# Patient Record
Sex: Female | Born: 1955
Health system: Southern US, Community
[De-identification: ages and names within clinical notes are randomized; demographics above are authoritative.]

## PROBLEM LIST (undated history)

## (undated) DIAGNOSIS — T7840XA Allergy, unspecified, initial encounter: Secondary | ICD-10-CM

## (undated) DIAGNOSIS — J45909 Unspecified asthma, uncomplicated: Secondary | ICD-10-CM

## (undated) DIAGNOSIS — M3500 Sicca syndrome, unspecified: Secondary | ICD-10-CM

## (undated) DIAGNOSIS — H269 Unspecified cataract: Secondary | ICD-10-CM

## (undated) DIAGNOSIS — K219 Gastro-esophageal reflux disease without esophagitis: Secondary | ICD-10-CM

## (undated) DIAGNOSIS — M810 Age-related osteoporosis without current pathological fracture: Secondary | ICD-10-CM

## (undated) DIAGNOSIS — I7789 Other specified disorders of arteries and arterioles: Secondary | ICD-10-CM

## (undated) HISTORY — DX: Allergy, unspecified, initial encounter: T78.40XA

## (undated) HISTORY — DX: Unspecified cataract: H26.9

## (undated) HISTORY — DX: Other specified disorders of arteries and arterioles: I77.89

## (undated) HISTORY — PX: TONSILLECTOMY: SUR1361

## (undated) HISTORY — DX: Sjogren syndrome, unspecified: M35.00

## (undated) HISTORY — DX: Gastro-esophageal reflux disease without esophagitis: K21.9

## (undated) HISTORY — DX: Age-related osteoporosis without current pathological fracture: M81.0

## (undated) HISTORY — DX: Unspecified asthma, uncomplicated: J45.909

## (undated) HISTORY — PX: EYE SURGERY: SHX253

## (undated) HISTORY — PX: COLONOSCOPY: SHX174

## (undated) HISTORY — PX: KNEE ARTHROSCOPY: SUR90

## (undated) HISTORY — PX: COSMETIC SURGERY: SHX468

---

## 1997-03-12 ENCOUNTER — Ambulatory Visit (HOSPITAL_COMMUNITY): Admission: RE | Admit: 1997-03-12 | Discharge: 1997-03-12 | Payer: Self-pay | Admitting: Family Medicine

## 1997-05-29 ENCOUNTER — Emergency Department (HOSPITAL_COMMUNITY): Admission: EM | Admit: 1997-05-29 | Discharge: 1997-05-29 | Payer: Self-pay | Admitting: Emergency Medicine

## 1997-06-06 ENCOUNTER — Ambulatory Visit (HOSPITAL_COMMUNITY): Admission: RE | Admit: 1997-06-06 | Discharge: 1997-06-06 | Payer: Self-pay | Admitting: Cardiology

## 1997-07-11 ENCOUNTER — Ambulatory Visit (HOSPITAL_COMMUNITY): Admission: RE | Admit: 1997-07-11 | Discharge: 1997-07-11 | Payer: Self-pay | Admitting: Cardiology

## 1997-08-17 ENCOUNTER — Encounter: Admission: RE | Admit: 1997-08-17 | Discharge: 1997-11-15 | Payer: Self-pay | Admitting: Family Medicine

## 1997-08-20 ENCOUNTER — Ambulatory Visit (HOSPITAL_COMMUNITY): Admission: RE | Admit: 1997-08-20 | Discharge: 1997-08-20 | Payer: Self-pay | Admitting: Family Medicine

## 1997-10-15 ENCOUNTER — Encounter: Admission: RE | Admit: 1997-10-15 | Discharge: 1997-10-15 | Payer: Self-pay | Admitting: Sports Medicine

## 1998-04-04 ENCOUNTER — Ambulatory Visit (HOSPITAL_COMMUNITY): Admission: RE | Admit: 1998-04-04 | Discharge: 1998-04-04 | Payer: Self-pay | Admitting: Family Medicine

## 1998-04-04 ENCOUNTER — Encounter: Payer: Self-pay | Admitting: Family Medicine

## 1998-05-07 ENCOUNTER — Other Ambulatory Visit: Admission: RE | Admit: 1998-05-07 | Discharge: 1998-05-07 | Payer: Self-pay | Admitting: Obstetrics and Gynecology

## 1998-12-20 ENCOUNTER — Other Ambulatory Visit: Admission: RE | Admit: 1998-12-20 | Discharge: 1998-12-20 | Payer: Self-pay | Admitting: Obstetrics and Gynecology

## 1999-04-09 ENCOUNTER — Ambulatory Visit (HOSPITAL_COMMUNITY): Admission: RE | Admit: 1999-04-09 | Discharge: 1999-04-09 | Payer: Self-pay | Admitting: Family Medicine

## 1999-04-09 ENCOUNTER — Encounter: Payer: Self-pay | Admitting: Family Medicine

## 1999-04-21 ENCOUNTER — Encounter: Admission: RE | Admit: 1999-04-21 | Discharge: 1999-04-21 | Payer: Self-pay | Admitting: Sports Medicine

## 1999-12-29 ENCOUNTER — Encounter: Admission: RE | Admit: 1999-12-29 | Discharge: 1999-12-29 | Payer: Self-pay | Admitting: Sports Medicine

## 2000-01-06 ENCOUNTER — Other Ambulatory Visit: Admission: RE | Admit: 2000-01-06 | Discharge: 2000-01-06 | Payer: Self-pay | Admitting: Obstetrics and Gynecology

## 2000-01-19 ENCOUNTER — Encounter: Admission: RE | Admit: 2000-01-19 | Discharge: 2000-04-18 | Payer: Self-pay | Admitting: Sports Medicine

## 2000-04-16 ENCOUNTER — Encounter: Admission: RE | Admit: 2000-04-16 | Discharge: 2000-04-16 | Payer: Self-pay | Admitting: Family Medicine

## 2000-04-22 ENCOUNTER — Ambulatory Visit (HOSPITAL_COMMUNITY): Admission: RE | Admit: 2000-04-22 | Discharge: 2000-04-22 | Payer: Self-pay | Admitting: Family Medicine

## 2000-04-22 ENCOUNTER — Encounter: Payer: Self-pay | Admitting: Family Medicine

## 2000-06-03 ENCOUNTER — Other Ambulatory Visit: Admission: RE | Admit: 2000-06-03 | Discharge: 2000-06-03 | Payer: Self-pay | Admitting: Family Medicine

## 2000-08-18 ENCOUNTER — Encounter: Admission: RE | Admit: 2000-08-18 | Discharge: 2000-08-18 | Payer: Self-pay | Admitting: Family Medicine

## 2000-11-30 ENCOUNTER — Encounter: Admission: RE | Admit: 2000-11-30 | Discharge: 2000-11-30 | Payer: Self-pay | Admitting: Sports Medicine

## 2000-11-30 ENCOUNTER — Ambulatory Visit (HOSPITAL_COMMUNITY): Admission: RE | Admit: 2000-11-30 | Discharge: 2000-11-30 | Payer: Self-pay | Admitting: Sports Medicine

## 2001-01-12 ENCOUNTER — Encounter: Admission: RE | Admit: 2001-01-12 | Discharge: 2001-01-12 | Payer: Self-pay | Admitting: Family Medicine

## 2001-02-23 ENCOUNTER — Encounter: Admission: RE | Admit: 2001-02-23 | Discharge: 2001-02-23 | Payer: Self-pay | Admitting: Family Medicine

## 2001-02-23 ENCOUNTER — Encounter: Payer: Self-pay | Admitting: Family Medicine

## 2001-08-11 ENCOUNTER — Encounter: Admission: RE | Admit: 2001-08-11 | Discharge: 2001-08-11 | Payer: Self-pay | Admitting: Sports Medicine

## 2001-10-05 ENCOUNTER — Encounter: Admission: RE | Admit: 2001-10-05 | Discharge: 2001-10-05 | Payer: Self-pay | Admitting: Family Medicine

## 2002-07-31 ENCOUNTER — Encounter: Admission: RE | Admit: 2002-07-31 | Discharge: 2002-07-31 | Payer: Self-pay | Admitting: Family Medicine

## 2002-10-25 ENCOUNTER — Encounter: Admission: RE | Admit: 2002-10-25 | Discharge: 2002-10-25 | Payer: Self-pay | Admitting: Family Medicine

## 2002-11-08 ENCOUNTER — Encounter: Admission: RE | Admit: 2002-11-08 | Discharge: 2002-11-08 | Payer: Self-pay | Admitting: Family Medicine

## 2002-11-10 ENCOUNTER — Encounter: Admission: RE | Admit: 2002-11-10 | Discharge: 2002-11-10 | Payer: Self-pay | Admitting: Family Medicine

## 2003-01-10 ENCOUNTER — Ambulatory Visit (HOSPITAL_COMMUNITY): Admission: RE | Admit: 2003-01-10 | Discharge: 2003-01-10 | Payer: Self-pay | Admitting: Gastroenterology

## 2003-03-01 ENCOUNTER — Emergency Department (HOSPITAL_COMMUNITY): Admission: AD | Admit: 2003-03-01 | Discharge: 2003-03-01 | Payer: Self-pay | Admitting: Family Medicine

## 2003-03-14 ENCOUNTER — Encounter: Admission: RE | Admit: 2003-03-14 | Discharge: 2003-03-14 | Payer: Self-pay | Admitting: Family Medicine

## 2003-03-21 ENCOUNTER — Encounter: Admission: RE | Admit: 2003-03-21 | Discharge: 2003-03-21 | Payer: Self-pay | Admitting: Family Medicine

## 2003-04-24 ENCOUNTER — Encounter: Admission: RE | Admit: 2003-04-24 | Discharge: 2003-04-24 | Payer: Self-pay | Admitting: Family Medicine

## 2003-05-04 ENCOUNTER — Encounter: Admission: RE | Admit: 2003-05-04 | Discharge: 2003-05-04 | Payer: Self-pay | Admitting: Family Medicine

## 2004-04-23 ENCOUNTER — Ambulatory Visit (HOSPITAL_COMMUNITY): Admission: RE | Admit: 2004-04-23 | Discharge: 2004-04-23 | Payer: Self-pay | Admitting: Sports Medicine

## 2004-05-08 ENCOUNTER — Encounter (INDEPENDENT_AMBULATORY_CARE_PROVIDER_SITE_OTHER): Payer: Self-pay | Admitting: *Deleted

## 2004-05-08 LAB — CONVERTED CEMR LAB

## 2004-05-28 ENCOUNTER — Encounter (INDEPENDENT_AMBULATORY_CARE_PROVIDER_SITE_OTHER): Payer: Self-pay | Admitting: *Deleted

## 2004-05-28 ENCOUNTER — Ambulatory Visit: Payer: Self-pay | Admitting: Family Medicine

## 2004-09-10 ENCOUNTER — Ambulatory Visit: Payer: Self-pay | Admitting: Family Medicine

## 2004-11-19 ENCOUNTER — Ambulatory Visit: Payer: Self-pay | Admitting: Family Medicine

## 2004-12-12 ENCOUNTER — Ambulatory Visit: Payer: Self-pay | Admitting: Family Medicine

## 2005-01-02 ENCOUNTER — Ambulatory Visit: Payer: Self-pay | Admitting: Sports Medicine

## 2005-01-05 ENCOUNTER — Encounter: Admission: RE | Admit: 2005-01-05 | Discharge: 2005-01-05 | Payer: Self-pay | Admitting: Sports Medicine

## 2005-01-13 ENCOUNTER — Ambulatory Visit: Payer: Self-pay | Admitting: Sports Medicine

## 2005-02-17 ENCOUNTER — Ambulatory Visit: Payer: Self-pay | Admitting: Sports Medicine

## 2005-05-06 ENCOUNTER — Ambulatory Visit (HOSPITAL_COMMUNITY): Admission: RE | Admit: 2005-05-06 | Discharge: 2005-05-06 | Payer: Self-pay | Admitting: Sports Medicine

## 2005-07-27 ENCOUNTER — Ambulatory Visit: Payer: Self-pay | Admitting: Family Medicine

## 2005-08-19 ENCOUNTER — Ambulatory Visit: Payer: Self-pay | Admitting: Family Medicine

## 2005-08-26 ENCOUNTER — Ambulatory Visit: Payer: Self-pay | Admitting: Family Medicine

## 2005-09-09 ENCOUNTER — Ambulatory Visit: Payer: Self-pay | Admitting: Family Medicine

## 2005-09-20 ENCOUNTER — Emergency Department (HOSPITAL_COMMUNITY): Admission: EM | Admit: 2005-09-20 | Discharge: 2005-09-20 | Payer: Self-pay | Admitting: Emergency Medicine

## 2005-09-23 ENCOUNTER — Encounter (HOSPITAL_COMMUNITY): Admission: RE | Admit: 2005-09-23 | Discharge: 2005-12-22 | Payer: Self-pay | Admitting: Emergency Medicine

## 2005-10-28 ENCOUNTER — Emergency Department (HOSPITAL_COMMUNITY): Admission: EM | Admit: 2005-10-28 | Discharge: 2005-10-28 | Payer: Self-pay | Admitting: Family Medicine

## 2006-04-02 ENCOUNTER — Encounter (INDEPENDENT_AMBULATORY_CARE_PROVIDER_SITE_OTHER): Payer: Self-pay | Admitting: *Deleted

## 2006-05-31 ENCOUNTER — Ambulatory Visit (HOSPITAL_COMMUNITY): Admission: RE | Admit: 2006-05-31 | Discharge: 2006-05-31 | Payer: Self-pay | Admitting: Family Medicine

## 2006-10-08 ENCOUNTER — Ambulatory Visit: Payer: Self-pay | Admitting: Sports Medicine

## 2006-10-08 DIAGNOSIS — M2010 Hallux valgus (acquired), unspecified foot: Secondary | ICD-10-CM | POA: Insufficient documentation

## 2006-10-13 ENCOUNTER — Ambulatory Visit: Payer: Self-pay | Admitting: Sports Medicine

## 2007-03-27 ENCOUNTER — Emergency Department (HOSPITAL_COMMUNITY): Admission: EM | Admit: 2007-03-27 | Discharge: 2007-03-27 | Payer: Self-pay | Admitting: Family Medicine

## 2007-04-15 ENCOUNTER — Ambulatory Visit: Payer: Self-pay | Admitting: Family Medicine

## 2007-07-01 ENCOUNTER — Ambulatory Visit (HOSPITAL_COMMUNITY): Admission: RE | Admit: 2007-07-01 | Discharge: 2007-07-01 | Payer: Self-pay | Admitting: Sports Medicine

## 2007-08-17 ENCOUNTER — Ambulatory Visit: Payer: Self-pay | Admitting: Family Medicine

## 2007-08-17 ENCOUNTER — Encounter: Payer: Self-pay | Admitting: Family Medicine

## 2007-08-17 ENCOUNTER — Telehealth: Payer: Self-pay | Admitting: *Deleted

## 2007-08-17 DIAGNOSIS — J45909 Unspecified asthma, uncomplicated: Secondary | ICD-10-CM | POA: Insufficient documentation

## 2007-08-26 ENCOUNTER — Encounter: Payer: Self-pay | Admitting: Family Medicine

## 2008-02-09 ENCOUNTER — Ambulatory Visit (HOSPITAL_COMMUNITY): Admission: RE | Admit: 2008-02-09 | Discharge: 2008-02-09 | Payer: Self-pay | Admitting: Sports Medicine

## 2008-02-09 ENCOUNTER — Ambulatory Visit: Payer: Self-pay | Admitting: Sports Medicine

## 2008-02-09 DIAGNOSIS — M23359 Other meniscus derangements, posterior horn of lateral meniscus, unspecified knee: Secondary | ICD-10-CM | POA: Insufficient documentation

## 2008-08-07 ENCOUNTER — Ambulatory Visit (HOSPITAL_COMMUNITY): Admission: RE | Admit: 2008-08-07 | Discharge: 2008-08-07 | Payer: Self-pay | Admitting: Family Medicine

## 2008-08-09 ENCOUNTER — Encounter: Payer: Self-pay | Admitting: Family Medicine

## 2008-11-01 ENCOUNTER — Ambulatory Visit (HOSPITAL_COMMUNITY): Admission: RE | Admit: 2008-11-01 | Discharge: 2008-11-01 | Payer: Self-pay | Admitting: Gastroenterology

## 2008-11-02 ENCOUNTER — Emergency Department (HOSPITAL_COMMUNITY): Admission: EM | Admit: 2008-11-02 | Discharge: 2008-11-02 | Payer: Self-pay | Admitting: Emergency Medicine

## 2008-11-04 ENCOUNTER — Encounter: Payer: Self-pay | Admitting: Family Medicine

## 2008-11-09 ENCOUNTER — Ambulatory Visit: Payer: Self-pay | Admitting: Family Medicine

## 2008-11-09 ENCOUNTER — Encounter: Payer: Self-pay | Admitting: Family Medicine

## 2008-11-09 LAB — CONVERTED CEMR LAB
Basophils Absolute: 0 10*3/uL (ref 0.0–0.1)
Basophils Relative: 1 % (ref 0–1)
Eosinophils Absolute: 0.2 10*3/uL (ref 0.0–0.7)
Eosinophils Relative: 3 % (ref 0–5)
Hemoglobin: 14.4 g/dL (ref 12.0–15.0)
Lymphocytes Relative: 31 % (ref 12–46)
Lymphs Abs: 1.9 10*3/uL (ref 0.7–4.0)
Monocytes Absolute: 0.4 10*3/uL (ref 0.1–1.0)
WBC: 5.9 10*3/uL (ref 4.0–10.5)

## 2008-12-05 ENCOUNTER — Ambulatory Visit: Payer: Self-pay | Admitting: Family Medicine

## 2008-12-05 DIAGNOSIS — E538 Deficiency of other specified B group vitamins: Secondary | ICD-10-CM | POA: Insufficient documentation

## 2008-12-05 LAB — CONVERTED CEMR LAB: TSH: 2.395 microintl units/mL (ref 0.350–4.500)

## 2008-12-11 ENCOUNTER — Encounter: Payer: Self-pay | Admitting: Family Medicine

## 2008-12-19 ENCOUNTER — Ambulatory Visit: Payer: Self-pay | Admitting: Family Medicine

## 2008-12-19 ENCOUNTER — Ambulatory Visit (HOSPITAL_COMMUNITY): Admission: RE | Admit: 2008-12-19 | Discharge: 2008-12-19 | Payer: Self-pay | Admitting: Family Medicine

## 2008-12-20 ENCOUNTER — Encounter: Payer: Self-pay | Admitting: Family Medicine

## 2009-02-05 ENCOUNTER — Encounter (INDEPENDENT_AMBULATORY_CARE_PROVIDER_SITE_OTHER): Payer: Self-pay | Admitting: Family Medicine

## 2009-02-13 ENCOUNTER — Encounter: Payer: Self-pay | Admitting: Family Medicine

## 2009-02-14 ENCOUNTER — Encounter: Payer: Self-pay | Admitting: Family Medicine

## 2009-04-26 ENCOUNTER — Encounter: Payer: Self-pay | Admitting: Family Medicine

## 2009-05-20 ENCOUNTER — Encounter: Payer: Self-pay | Admitting: Family Medicine

## 2009-05-20 DIAGNOSIS — M3500 Sicca syndrome, unspecified: Secondary | ICD-10-CM | POA: Insufficient documentation

## 2009-11-11 ENCOUNTER — Encounter: Payer: Self-pay | Admitting: Family Medicine

## 2009-12-12 ENCOUNTER — Ambulatory Visit (HOSPITAL_COMMUNITY): Admission: RE | Admit: 2009-12-12 | Discharge: 2009-12-12 | Payer: Self-pay | Admitting: Family Medicine

## 2010-03-06 NOTE — Miscellaneous (Signed)
  Clinical Lists Changes  Problems: Removed problem of SKIN RASH (ICD-782.1) Removed problem of TOE PAIN (ICD-729.5) Removed problem of HERPANGINA (ICD-074.0) Removed problem of GLOSSITIS (ICD-529.0) Removed problem of KNEE PAIN, LEFT (ICD-719.46) Added new problem of SICCA SYNDROME (ICD-710.2) Medications: Added new medication of PLAQUENIL 200 MG TABS (HYDROXYCHLOROQUINE SULFATE) per rheumatology

## 2010-03-06 NOTE — Consult Note (Signed)
Summary: [facility]   Imported By: Bradly Bienenstock 05/08/2009 10:42:16  _____________________________________________________________________  External Attachment:    Type:   Image     Comment:   External Document

## 2010-03-06 NOTE — Miscellaneous (Signed)
   Clinical Lists Changes  Orders: Added new Referral order of Rheumatology Referral (Rheumatology) - Signed Observations: Added new observation of LLIMPORTMEDS: completed (02/05/2009 8:36)     DEAR Thaily Hackworth TEAM I am sending you a referral Thanks!  Denny Levy MD  February 05, 2009 8:38 AM   Complete Medication List: 1)  Ventolin Hfa 108 (90 Base) Mcg/act Aers (Albuterol sulfate) .Marland Kitchen.. 1-2 puffs as needed disp 1 or 3 months worth of mdi 2)  Singulair 10 Mg Tabs (Montelukast sodium) .Marland Kitchen.. 1 by mouth qd 3)  Advair Diskus 500-50 Mcg/dose Misc (Fluticasone-salmeterol) .Marland Kitchen.. 1 puff two times a day disp 1 or 3  months mdi 4)  Celebrex 200 Mg Caps (Celecoxib) .Marland Kitchen.. 1 by mouth bid 5)  Clobetasol Propionate 0.05 % Crea (Clobetasol propionate) .... Dispense one 30 g tube apply as directed 6)  Premarin 0.625 Mg/gm Crea (Estrogens, conjugated) .Marland Kitchen.. 1 applicator full per vagina at bedtime 3-4 times a week please disp qs    appointment scheduled 02/07/2009 with Dr. Corliss Skains. patient notified and this is not convenient, she will call to reschedule. Theresia Lo RN  February 05, 2009 11:09 AM

## 2010-03-06 NOTE — Miscellaneous (Signed)
  Clinical Lists Changes  Problems: Changed problem from EXTRINSIC ASTHMA, UNSPECIFIED (ICD-493.00) to ASTHMA, INTERMITTENT, MILD (ICD-493.90)

## 2010-03-06 NOTE — Consult Note (Signed)
Summary: [facility]  [facility]   Imported By: Bradly Bienenstock 03/05/2009 16:52:05  _____________________________________________________________________  External Attachment:    Type:   Image     Comment:   External Document

## 2010-05-16 ENCOUNTER — Other Ambulatory Visit: Payer: Self-pay | Admitting: Internal Medicine

## 2010-05-22 ENCOUNTER — Encounter (INDEPENDENT_AMBULATORY_CARE_PROVIDER_SITE_OTHER): Payer: Commercial Managed Care - PPO | Admitting: Internal Medicine

## 2010-05-22 ENCOUNTER — Other Ambulatory Visit (HOSPITAL_COMMUNITY)
Admission: RE | Admit: 2010-05-22 | Discharge: 2010-05-22 | Disposition: A | Discharge status: Home / Self Care | Payer: 59 | Source: Ambulatory Visit | Attending: Internal Medicine | Admitting: Internal Medicine

## 2010-05-22 DIAGNOSIS — Z0000 Encounter for general adult medical examination without abnormal findings: Secondary | ICD-10-CM

## 2010-06-20 NOTE — Op Note (Signed)
NAME:  Jacqueline Henry, Jacqueline Henry                            ACCOUNT NO.:  [id number]   MEDICAL RECORD NO.:  [id number]                   PATIENT TYPE:  AMB   LOCATION:  ENDO                                 FACILITY:  [facility]   PHYSICIAN:  Bernette Redbird, M.D.                DATE OF BIRTH:  Oct 28, 1955   DATE OF PROCEDURE:  01/10/2003  DATE OF DISCHARGE:                                 OPERATIVE REPORT   PROCEDURE:  Colonoscopy.   INDICATIONS:  Family history of colon cancer in her father at the age of 69,  in a 78 year old asymptomatic female.   FINDINGS:  Solitary sigmoid diverticulum, otherwise normal to the terminal  ileum.   DESCRIPTION OF PROCEDURE:  The nature, purpose and risks of the procedure  were familiar to the patient from a prior examination five years ago, when  her examination was negative except for hyperplastic polyp.  She provided  written consent.   The Olympus adjustable tension pediatric video colonoscope was advanced with  slight difficulty to an angulated sigmoid region, turning the patient into  the supine position to facilitate advancement.  We reached the cecum and the  terminal ileum was entered for a short distance; appeared normal.  Pullback  was then performed.  The quality of the prep was excellent and it was felt  that all areas were well seen.   There was a solitary sigmoid diverticulum, but this was otherwise a normal  examination; without evidence of polyps, cancer, colitis, vascular  malformations or extensive diverticulosis.  Retroflexion of the rectum and  on reinspection the rectum was negative.  No biopsies were obtained.  The  patient tolerated the procedure well and there were no apparent  complications.   IMPRESSION:  Essentially normal colonoscopy in a patient with a family  history of colon cancer (V16.0).   PLAN:  Follow with colonoscopy in five years.                                               Bernette Redbird, M.D.    RB/MEDQ  D:   01/10/2003  T:  01/11/2003  Job:  [id number]   cc:   Asencion Partridge, M.D.  [address] [place]  [place] [address]  Fax: [phone]

## 2011-01-02 ENCOUNTER — Other Ambulatory Visit: Payer: Self-pay | Admitting: Internal Medicine

## 2011-01-02 DIAGNOSIS — Z1231 Encounter for screening mammogram for malignant neoplasm of breast: Secondary | ICD-10-CM

## 2011-01-02 DIAGNOSIS — Z780 Asymptomatic menopausal state: Secondary | ICD-10-CM

## 2011-02-04 ENCOUNTER — Telehealth: Payer: Self-pay

## 2011-02-04 DIAGNOSIS — Z780 Asymptomatic menopausal state: Secondary | ICD-10-CM

## 2011-02-04 NOTE — Telephone Encounter (Signed)
Order placed in Epic for bone Density study.

## 2011-02-05 ENCOUNTER — Ambulatory Visit (HOSPITAL_COMMUNITY)
Admission: RE | Admit: 2011-02-05 | Discharge: 2011-02-05 | Disposition: A | Discharge status: Home / Self Care | Payer: 59 | Source: Ambulatory Visit | Attending: Internal Medicine | Admitting: Internal Medicine

## 2011-02-05 ENCOUNTER — Ambulatory Visit (HOSPITAL_COMMUNITY): Payer: Commercial Managed Care - PPO

## 2011-02-05 DIAGNOSIS — Z1231 Encounter for screening mammogram for malignant neoplasm of breast: Secondary | ICD-10-CM | POA: Insufficient documentation

## 2011-02-05 DIAGNOSIS — Z780 Asymptomatic menopausal state: Secondary | ICD-10-CM | POA: Insufficient documentation

## 2011-02-05 DIAGNOSIS — Z13820 Encounter for screening for osteoporosis: Secondary | ICD-10-CM | POA: Insufficient documentation

## 2011-02-09 ENCOUNTER — Other Ambulatory Visit: Payer: Commercial Managed Care - PPO | Admitting: Internal Medicine

## 2011-02-09 DIAGNOSIS — E7800 Pure hypercholesterolemia, unspecified: Secondary | ICD-10-CM

## 2011-02-09 DIAGNOSIS — Z79899 Other long term (current) drug therapy: Secondary | ICD-10-CM

## 2011-02-09 LAB — LIPID PANEL
Cholesterol: 212 mg/dL — ABNORMAL HIGH (ref 0–200)
HDL: 64 mg/dL (ref >39)
LDL Cholesterol: 134 mg/dL — ABNORMAL HIGH (ref 0–99)
Total CHOL/HDL Ratio: 3.3 Ratio
Triglycerides: 71 mg/dL (ref <150)
VLDL: 14 mg/dL (ref 0–40)

## 2011-02-09 LAB — HEPATIC FUNCTION PANEL
Bilirubin, Direct: 0.1 mg/dL (ref 0.0–0.3)
Total Bilirubin: 0.8 mg/dL (ref 0.3–1.2)
Total Protein: 6.9 g/dL (ref 6.0–8.3)

## 2011-02-17 ENCOUNTER — Telehealth: Payer: Self-pay

## 2011-02-17 NOTE — Telephone Encounter (Signed)
Opened in error

## 2012-04-11 ENCOUNTER — Other Ambulatory Visit: Payer: Self-pay | Admitting: Internal Medicine

## 2012-04-11 DIAGNOSIS — Z1231 Encounter for screening mammogram for malignant neoplasm of breast: Secondary | ICD-10-CM

## 2012-04-20 ENCOUNTER — Ambulatory Visit (HOSPITAL_COMMUNITY): Payer: 59

## 2012-04-22 ENCOUNTER — Encounter: Payer: Self-pay | Admitting: Internal Medicine

## 2012-04-22 ENCOUNTER — Ambulatory Visit (INDEPENDENT_AMBULATORY_CARE_PROVIDER_SITE_OTHER): Payer: 59 | Admitting: Internal Medicine

## 2012-04-22 VITALS — BP 106/72 | Temp 98.8°F

## 2012-04-22 DIAGNOSIS — J45909 Unspecified asthma, uncomplicated: Secondary | ICD-10-CM

## 2012-04-22 MED ORDER — ALBUTEROL SULFATE HFA 108 (90 BASE) MCG/ACT IN AERS: 1.0000 | INHALATION_SPRAY | RESPIRATORY_TRACT | Status: DC | PRN

## 2012-04-22 MED ORDER — AMOXICILLIN-POT CLAVULANATE 875-125 MG PO TABS: 1.0000 | ORAL_TABLET | Freq: Two times a day (BID) | ORAL | Status: DC

## 2012-04-22 MED ORDER — FLUTICASONE-SALMETEROL 500-50 MCG/DOSE IN AEPB: 1.0000 | INHALATION_SPRAY | Freq: Two times a day (BID) | RESPIRATORY_TRACT | Status: DC

## 2012-04-22 MED ORDER — PREDNISONE 10 MG PO KIT: PACK | ORAL | Status: DC

## 2012-04-22 MED ORDER — BENZONATATE 100 MG PO CAPS: 100.0000 mg | ORAL_CAPSULE | Freq: Three times a day (TID) | ORAL | Status: DC | PRN

## 2012-04-25 NOTE — Patient Instructions (Addendum)
Take Augmentin 875 mg twice daily for 10 days. Continue using albuterol and Advair inhalers. Decrease amount of running until better.

## 2012-04-25 NOTE — Progress Notes (Signed)
  Subjective:    Patient ID: Jacqueline Henry, female    DOB: 1955/06/20, 57 y.o.   MRN: [id number]  HPI Developed respiratory infection about 3 weeks ago with cough that has persisted. Asthma has flared up. She has been using her inhaler. Cannot run quite as far due to shortness of breath and fatigue. She is an avid runner. No fever or shaking chills. Cough sounds deep and congested. Did have influenza immunization through employment.    Review of Systems     Objective:   Physical Exam HEENT exam: TMs and pharynx are clear. Neck is supple without adenopathy. Chest is clear.dictation without rales or wheezing.        Assessment & Plan:  Asthmatic bronchitis  Plan: Sterapred DS 10 mg 6 day dosepak take as directed. Augmentin 875 mg twice daily with meals for 10 days. Call if symptoms do not resolve. Continue using albuterol inhaler 4 times daily. Cut back on running until better. Continue albuterol and Advair inhalers. May need to restart Singulair. Refill Advair 500/50 one spray Q. 12 hours.

## 2012-04-27 ENCOUNTER — Ambulatory Visit (HOSPITAL_COMMUNITY)
Admission: RE | Admit: 2012-04-27 | Discharge: 2012-04-27 | Disposition: A | Discharge status: Home / Self Care | Payer: 59 | Source: Ambulatory Visit | Attending: Internal Medicine | Admitting: Internal Medicine

## 2012-04-27 DIAGNOSIS — Z1231 Encounter for screening mammogram for malignant neoplasm of breast: Secondary | ICD-10-CM | POA: Insufficient documentation

## 2012-05-04 ENCOUNTER — Ambulatory Visit (HOSPITAL_BASED_OUTPATIENT_CLINIC_OR_DEPARTMENT_OTHER)
Admission: RE | Admit: 2012-05-04 | Discharge: 2012-05-04 | Disposition: A | Discharge status: Home / Self Care | Payer: 59 | Source: Ambulatory Visit | Attending: Family Medicine | Admitting: Family Medicine

## 2012-05-04 ENCOUNTER — Encounter: Payer: Self-pay | Admitting: Family Medicine

## 2012-05-04 ENCOUNTER — Ambulatory Visit (INDEPENDENT_AMBULATORY_CARE_PROVIDER_SITE_OTHER): Payer: 59 | Admitting: Family Medicine

## 2012-05-04 VITALS — BP 122/80 | HR 58 | Ht 65.0 in | Wt 112.0 lb

## 2012-05-04 DIAGNOSIS — M545 Low back pain, unspecified: Secondary | ICD-10-CM | POA: Insufficient documentation

## 2012-05-04 MED ORDER — PREDNISONE (PAK) 10 MG PO TABS: ORAL_TABLET | ORAL | Status: DC

## 2012-05-04 MED ORDER — CELECOXIB 200 MG PO CAPS: 200.0000 mg | ORAL_CAPSULE | Freq: Two times a day (BID) | ORAL | Status: DC

## 2012-05-04 NOTE — Patient Instructions (Addendum)
Get the x-rays of your low back today - we will call you with the results by tomorrow morning (depending on when these come back). I'm concerned about a bulging disc with radiculopathy in your low back (pinched nerve). A prednisone dose pack is the best option for immediate relief and may be prescribed with transition to an anti-inflammatory like celebrex twice a day. Consider Tramadol or vicodin as needed for severe pain (no driving on this medicine). Consider Flexeril as needed for muscle spasms (no driving on this medicine if it makes you sleepy). Stay as active as possible. Physical therapy has been shown to be helpful as well - continue your home exercises. Strengthening of low back muscles, abdominal musculature are key for long term pain relief. If not improving, will consider further imaging (MRI). Follow up with me in 1 week if not improving.

## 2012-05-05 ENCOUNTER — Encounter: Payer: Self-pay | Admitting: Family Medicine

## 2012-05-05 DIAGNOSIS — M545 Low back pain, unspecified: Secondary | ICD-10-CM | POA: Insufficient documentation

## 2012-05-05 NOTE — Assessment & Plan Note (Signed)
Radiographs negative for compression fracture.  Consistent with lumbar radiculopathy though level would typically be higher than her prior radiculopathic episode.  Start with prednisone dose pack then transition to celebrex.  Continue home exercises.  F/u in 1 week if not improving - would consider repeating her lumbar spine MRI at that time.

## 2012-05-05 NOTE — Progress Notes (Signed)
Subjective:    Patient ID: Jacqueline Henry, female    DOB: Jun 27, 1955, 57 y.o.   MRN: [id number]  PCP: Dr. Lenord Fellers   HPI 57 yo F here for low back pain.  Patient has remote history of bulging disc left side - improved with conservative care. States most recently developed pain in left side of low back about 7 days ago. No known injury. No increase in activity level. She cycles 100-125 miles per week and runs 25-30 miles a week along with weight training. Thinks may have started the day she picked dog up out of car but no pain with that maneuver. 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 Has osteopenia. + night pain. Radiates into left hip and groin - prior issues with back led to posterior radiation.  Past Medical History  Diagnosis Date  . Sjogren's disease     Current Outpatient Prescriptions on File Prior to Visit  Medication Sig Dispense Refill  . clobetasol (TEMOVATE) 0.05 % cream Apply topically as directed.        . conjugated estrogens (PREMARIN) vaginal cream 1 applicator full per vagina at bedtime 3 to 4 times a week       . Fluticasone-Salmeterol (ADVAIR DISKUS) 500-50 MCG/DOSE AEPB Inhale 1 puff into the lungs 2 (two) times daily.  60 each  2  . montelukast (SINGULAIR) 10 MG tablet Take 10 mg by mouth daily.         No current facility-administered medications on file prior to visit.    Past Surgical History  Procedure Laterality Date  . Knee surgery Left ~2000    arthroscopy    Allergies  Allergen Reactions  . Codeine Nausea And Vomiting    History   Social History  . Marital Status: Married    Spouse Name: N/A    Number of Children: N/A  . Years of Education: N/A   Occupational History  . Not on file.   Social History Main Topics  . Smoking status: Never Smoker   . Smokeless tobacco: Never Used  . Alcohol Use: Yes     Comment: socially  . Drug Use: No  . Sexually Active: Not on file   Other Topics Concern  .  Not on file   Social History Narrative  . No narrative on file    Family History  Problem Relation Age of Onset  . Hypertension Mother   . Hyperlipidemia Father   . Heart attack Father   . Diabetes Neg Hx   . Sudden death Neg Hx     BP 122/80  Pulse 58  Ht 5\' 5"  (1.651 m)  Wt 112 lb (50.803 kg)  BMI 18.64 kg/m2  Review of Systems See HPI above.    Objective:   Physical Exam Gen: NAD  Back: No gross deformity, scoliosis. TTP mildly lateral to SI joint left hip.  No midline or bony TTP.  No other paraspinal tenderness.  No SI, trochanter, other tenderness. FROM with pain on extension. Strength LEs 5/5 all muscle groups.   2+ MSRs in patellar and achilles tendons, equal bilaterally. Positive SLR on left, negative right. Sensation intact to light touch bilaterally. Negative logroll bilateral hips Negative fabers and piriformis stretches.    Assessment & Plan:  1. Low back pain - Radiographs negative for compression fracture.  Consistent with lumbar radiculopathy though level would typically be higher than her prior radiculopathic episode.  Start with prednisone dose pack then transition  to celebrex.  Continue home exercises.  F/u in 1 week if not improving - would consider repeating her lumbar spine MRI at that time.

## 2012-06-07 ENCOUNTER — Encounter: Payer: Self-pay | Admitting: *Deleted

## 2012-06-07 DIAGNOSIS — R0602 Shortness of breath: Secondary | ICD-10-CM

## 2012-06-07 DIAGNOSIS — R0789 Other chest pain: Secondary | ICD-10-CM

## 2012-06-08 ENCOUNTER — Other Ambulatory Visit: Payer: 59

## 2012-06-08 DIAGNOSIS — R0602 Shortness of breath: Secondary | ICD-10-CM

## 2012-06-08 LAB — CBC
HCT: 42.4 % (ref 36.0–46.0)
MCHC: 33.3 g/dL (ref 30.0–36.0)
RDW: 14.2 % (ref 11.5–15.5)

## 2012-06-08 LAB — FERRITIN: Ferritin: 141 ng/mL (ref 10–291)

## 2012-06-08 NOTE — Progress Notes (Signed)
CBC AND FERRITIN DONE TODAY Jacqueline Henry 

## 2012-06-15 ENCOUNTER — Ambulatory Visit (HOSPITAL_BASED_OUTPATIENT_CLINIC_OR_DEPARTMENT_OTHER): Payer: 59 | Admitting: Sports Medicine

## 2012-06-15 ENCOUNTER — Ambulatory Visit (HOSPITAL_COMMUNITY)
Admission: RE | Admit: 2012-06-15 | Discharge: 2012-06-15 | Disposition: A | Discharge status: Home / Self Care | Payer: 59 | Source: Ambulatory Visit | Attending: Sports Medicine | Admitting: Sports Medicine

## 2012-06-15 DIAGNOSIS — R0602 Shortness of breath: Secondary | ICD-10-CM | POA: Insufficient documentation

## 2012-06-15 DIAGNOSIS — R0789 Other chest pain: Secondary | ICD-10-CM | POA: Insufficient documentation

## 2012-06-15 DIAGNOSIS — R5381 Other malaise: Secondary | ICD-10-CM

## 2012-06-15 NOTE — Assessment & Plan Note (Signed)
With a normal exercise tolerance test and a very high fitness level we need to look for other causes for her fatigue.  I will evaluate her history for possible relationship to autoimmune conditions.  We will schedule her in the office for exercise with peak flows to see that she's not getting pulmonary limitations.  Because of a history Of very dark urine after exercise we need to be sure she's not getting elevated levels of CPK.

## 2012-06-15 NOTE — Progress Notes (Signed)
Patient ID: Jacqueline Henry, female   DOB: May 19, 1955, 57 y.o.   MRN: [id number]  Patient was evaluated today with a exercise tolerance test because with exercise she had been having shortness of breath excessive fatigue and profuse sweating. She does have a history of possible Sjogren's syndrome. She is a very fit individual who works out both with [employer] and running most days of the week. She had not had similar symptoms prior to the past few weeks.  Of possible pertinence is that her father had an MI at age 78. She does not have palpitations or chest pain with her symptoms.  She does have a history of mild intermittent asthma but this has not seemed to been associated with wheezing.  Physical exam She is in no acute distress  Cardiac and pulmonary exams were unremarkable  Resting EKG showed a vertical axis and some small U waves in the lateral leads.  Exercise tolerance test showed her completing 13-1/2 minutes of the Bruce protocol. EKG tracings throughout this were unremarkable with up sloping ST depression but was never significant. Heart rate recovery was 25 beats at 1 minute. Maximum heart rate achieved was 173 which is greater than predicted. Maximum blood pressure was 210/90.  Performance would give her an estimated VO2 max of 52-55 any fitness level that correlates to 99% in her age group.

## 2012-06-21 ENCOUNTER — Ambulatory Visit: Payer: 59 | Admitting: Sports Medicine

## 2012-07-01 ENCOUNTER — Encounter: Payer: Self-pay | Admitting: Internal Medicine

## 2012-07-01 ENCOUNTER — Telehealth: Payer: Self-pay

## 2012-07-01 ENCOUNTER — Ambulatory Visit (INDEPENDENT_AMBULATORY_CARE_PROVIDER_SITE_OTHER): Payer: 59 | Admitting: Internal Medicine

## 2012-07-01 VITALS — Temp 98.0°F

## 2012-07-01 DIAGNOSIS — N342 Other urethritis: Secondary | ICD-10-CM

## 2012-07-01 DIAGNOSIS — R300 Dysuria: Secondary | ICD-10-CM

## 2012-07-01 LAB — POCT URINALYSIS DIPSTICK
Ketones, UA: NEGATIVE
Leukocytes, UA: NEGATIVE
Protein, UA: NEGATIVE
Spec Grav, UA: <=1.005
pH, UA: 5.5

## 2012-07-01 NOTE — Patient Instructions (Addendum)
Take Cipro 250 mg twice daily for 5 days. Culture has been sent. Should you developed Candida vaginitis on antibiotics take Diflucan 150 mg tablet once

## 2012-07-01 NOTE — Telephone Encounter (Signed)
Yes may come this am.

## 2012-07-01 NOTE — Telephone Encounter (Signed)
Feels like she may have a UTI coming on. Has bladder spasms and urinary frequency. Wants to know if she can just come in and leave a urine specimen. Has CPE next week.

## 2012-07-01 NOTE — Progress Notes (Signed)
  Subjective:    Patient ID: Jacqueline Henry, female    DOB: 03-20-1955, 57 y.o.   MRN: [id number]  HPI 3 day history of dysuria and urinary frequency. No fever or shaking chills. Going out of town this weekend.    Review of Systems     Objective:   Physical Exam No CVA tenderness. Urinalysis is unremarkable. Culture sent.       Assessment & Plan:  Urethritis  Plan: Since she is symptomatic will treat with Cipro 250 mg twice daily for 5 days. Should she develop Candida vaginitis on antibiotics Diflucan 150 mg tablet given. She has appointment physical exam next week.

## 2012-07-01 NOTE — Telephone Encounter (Signed)
Spoke with patient. Going out of town tomorrow. Will stop by this am and leave urine specimen.

## 2012-07-04 ENCOUNTER — Other Ambulatory Visit: Payer: 59 | Admitting: Internal Medicine

## 2012-07-04 DIAGNOSIS — Z130 Encounter for screening for diseases of the blood and blood-forming organs and certain disorders involving the immune mechanism: Secondary | ICD-10-CM

## 2012-07-04 DIAGNOSIS — Z0000 Encounter for general adult medical examination without abnormal findings: Secondary | ICD-10-CM

## 2012-07-04 DIAGNOSIS — Z1329 Encounter for screening for other suspected endocrine disorder: Secondary | ICD-10-CM

## 2012-07-04 DIAGNOSIS — Z13228 Encounter for screening for other metabolic disorders: Secondary | ICD-10-CM

## 2012-07-04 DIAGNOSIS — Z13220 Encounter for screening for lipoid disorders: Secondary | ICD-10-CM

## 2012-07-04 LAB — LIPID PANEL
Cholesterol: 227 mg/dL — ABNORMAL HIGH (ref 0–200)
HDL: 75 mg/dL (ref >39)
LDL Cholesterol: 136 mg/dL — ABNORMAL HIGH (ref 0–99)
Triglycerides: 79 mg/dL (ref <150)

## 2012-07-04 LAB — COMPREHENSIVE METABOLIC PANEL
CO2: 25 mEq/L (ref 19–32)
Creat: 0.96 mg/dL (ref 0.50–1.10)
Glucose, Bld: 86 mg/dL (ref 70–99)
Total Bilirubin: 1.4 mg/dL — ABNORMAL HIGH (ref 0.3–1.2)

## 2012-07-04 LAB — CBC WITH DIFFERENTIAL/PLATELET
Eosinophils Absolute: 0.3 10*3/uL (ref 0.0–0.7)
Eosinophils Relative: 6 % — ABNORMAL HIGH (ref 0–5)
HCT: 44.5 % (ref 36.0–46.0)
Hemoglobin: 15 g/dL (ref 12.0–15.0)
Lymphs Abs: 1.3 10*3/uL (ref 0.7–4.0)
MCH: 28.2 pg (ref 26.0–34.0)
MCV: 83.8 fL (ref 78.0–100.0)
Monocytes Absolute: 0.3 10*3/uL (ref 0.1–1.0)
Monocytes Relative: 6 % (ref 3–12)
RBC: 5.31 MIL/uL — ABNORMAL HIGH (ref 3.87–5.11)

## 2012-07-05 ENCOUNTER — Encounter: Payer: 59 | Admitting: Internal Medicine

## 2012-07-06 ENCOUNTER — Ambulatory Visit: Payer: 59 | Admitting: Sports Medicine

## 2012-08-04 ENCOUNTER — Other Ambulatory Visit (HOSPITAL_COMMUNITY)
Admission: RE | Admit: 2012-08-04 | Discharge: 2012-08-04 | Disposition: A | Discharge status: Home / Self Care | Payer: 59 | Source: Ambulatory Visit | Attending: Internal Medicine | Admitting: Internal Medicine

## 2012-08-04 ENCOUNTER — Encounter: Payer: Self-pay | Admitting: Internal Medicine

## 2012-08-04 ENCOUNTER — Ambulatory Visit (INDEPENDENT_AMBULATORY_CARE_PROVIDER_SITE_OTHER): Payer: 59 | Admitting: Internal Medicine

## 2012-08-04 VITALS — BP 112/78 | HR 64 | Ht 64.75 in | Wt 115.0 lb

## 2012-08-04 DIAGNOSIS — Z01419 Encounter for gynecological examination (general) (routine) without abnormal findings: Secondary | ICD-10-CM | POA: Insufficient documentation

## 2012-08-04 DIAGNOSIS — Z23 Encounter for immunization: Secondary | ICD-10-CM

## 2012-08-04 DIAGNOSIS — Z0000 Encounter for general adult medical examination without abnormal findings: Secondary | ICD-10-CM

## 2012-08-04 DIAGNOSIS — M810 Age-related osteoporosis without current pathological fracture: Secondary | ICD-10-CM

## 2012-08-04 LAB — POCT URINALYSIS DIPSTICK
Glucose, UA: NEGATIVE
Ketones, UA: NEGATIVE
Leukocytes, UA: NEGATIVE
Protein, UA: NEGATIVE

## 2012-08-04 MED ORDER — TETANUS-DIPHTH-ACELL PERTUSSIS 5-2.5-18.5 LF-MCG/0.5 IM SUSP: 0.5000 mL | Freq: Once | INTRAMUSCULAR | Status: DC

## 2012-12-08 ENCOUNTER — Other Ambulatory Visit: Payer: Self-pay

## 2013-01-29 DIAGNOSIS — M810 Age-related osteoporosis without current pathological fracture: Secondary | ICD-10-CM | POA: Insufficient documentation

## 2013-01-29 NOTE — Patient Instructions (Addendum)
Have a bone density study every 2-3 years. Return in one year or as needed.

## 2013-01-29 NOTE — Progress Notes (Signed)
Subjective:    Patient ID: Jacqueline Henry, female    DOB: 29-Oct-1955, 57 y.o.   MRN: [id number]  HPI  Pleasant 57 year old white female in today for health maintenance exam. She has a history of asthma and uses when necessary albuterol inhaler. Seems to be exercise induced. She is an avid runner. Has been diagnosed by Dr. Corliss Skains with Sjogren's disease.  Right knee arthroscopy approximately 1998 by Dr. Cleophas Dunker.  5 eye surgeries between 1959 and 1962.  Patient is intolerant of codeine and Darvon but has no known drug allergies.  Has had colonoscopy by Dr. Matthias Hughs September 2010 at [facility].  History of B12 deficiency and osteoporosis. Was started on Fosamax January 2013.  Social history: She is married. Husband is retired and has had prostate cancer. 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 No children. 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 Does not smoke or consume alcohol. Exercise consists of running about 20 miles a week, lifting weights 2 times a week and biking one or 2 times a week.  Family history: Father deceased with history of colon cancer and heart disease. Mother in her 18s with history of hypertension and hyperlipidemia. One brother in good health. One sister in her 86s with history of obesity, heart disease and breast cancer.    Review of Systems  Constitutional: Negative.   HENT: Negative.   Eyes:       Dry eyes  Respiratory: Negative.   Cardiovascular: Negative.   Gastrointestinal: Negative.   Endocrine: Negative.   Genitourinary: Negative.   Allergic/Immunologic: Negative.   Neurological: Negative.   Hematological: Negative.   Psychiatric/Behavioral: Negative.        Objective:   Physical Exam  Vitals reviewed. Constitutional: She is oriented to person, place, and time. She appears well-developed and well-nourished. No distress.  HENT:  Head:  Normocephalic and atraumatic.  Right Ear: External ear normal.  Left Ear: External ear normal.  Mouth/Throat: Oropharynx is clear and moist. No oropharyngeal exudate.  Eyes: Conjunctivae and EOM are normal. Pupils are equal, round, and reactive to light. Right eye exhibits no discharge. Left eye exhibits no discharge. No scleral icterus.  Neck: Neck supple. No JVD present. No thyromegaly present.  Cardiovascular: Normal rate, regular rhythm, normal heart sounds and intact distal pulses.   No murmur heard. Pulmonary/Chest: Effort normal and breath sounds normal. No respiratory distress. She has no wheezes. She has no rales. She exhibits no tenderness.  Breasts without masses  Abdominal: Soft. Bowel sounds are normal. She exhibits no distension and no mass. There is no tenderness. There is no rebound.  Genitourinary:  Pap taken2012. No masses on bimanual exam  Musculoskeletal: Normal range of motion. She exhibits no edema.  Lymphadenopathy:    She has no cervical adenopathy.  Neurological: She is alert and oriented to person, place, and time. She has normal reflexes. No cranial nerve deficit. Coordination normal.  Skin: Skin is warm and dry. No rash noted. She is not diaphoretic.  Psychiatric: She has a normal mood and affect. Her behavior is normal. Judgment and thought content normal.          Assessment & Plan:  History of Sjogren's syndrome  History of B12 deficiency   History of osteoporosis  History of asthma-stable  Plan: Patient was started on Fosamax January 2013 but I do not think has continued with this medication. Recommend vitamin D  supplement and calcium if she does not want to take Fosamax. Bone density study should be repeated in the near future. Recommend annual mammogram. Obtains annual influenza immunization through employment. Return one year or as needed.

## 2013-06-20 ENCOUNTER — Other Ambulatory Visit: Payer: Self-pay | Admitting: Internal Medicine

## 2013-06-20 DIAGNOSIS — Z1231 Encounter for screening mammogram for malignant neoplasm of breast: Secondary | ICD-10-CM

## 2013-06-28 ENCOUNTER — Ambulatory Visit (HOSPITAL_COMMUNITY)
Admission: RE | Admit: 2013-06-28 | Discharge: 2013-06-28 | Disposition: A | Discharge status: Home / Self Care | Payer: 59 | Source: Ambulatory Visit | Attending: Internal Medicine | Admitting: Internal Medicine

## 2013-06-28 DIAGNOSIS — Z1231 Encounter for screening mammogram for malignant neoplasm of breast: Secondary | ICD-10-CM | POA: Insufficient documentation

## 2013-08-31 ENCOUNTER — Other Ambulatory Visit: Payer: 59 | Admitting: Internal Medicine

## 2013-09-01 ENCOUNTER — Encounter: Payer: 59 | Admitting: Internal Medicine

## 2013-09-14 ENCOUNTER — Ambulatory Visit (INDEPENDENT_AMBULATORY_CARE_PROVIDER_SITE_OTHER): Payer: 59 | Admitting: Sports Medicine

## 2013-09-14 ENCOUNTER — Encounter: Payer: Self-pay | Admitting: Sports Medicine

## 2013-09-14 VITALS — BP 118/77 | HR 58 | Ht 65.0 in | Wt 112.0 lb

## 2013-09-14 DIAGNOSIS — M546 Pain in thoracic spine: Secondary | ICD-10-CM

## 2013-09-14 DIAGNOSIS — M62838 Other muscle spasm: Secondary | ICD-10-CM

## 2013-09-14 MED ORDER — TRAMADOL HCL 50 MG PO TABS: 50.0000 mg | ORAL_TABLET | Freq: Four times a day (QID) | ORAL | Status: DC | PRN

## 2013-09-14 MED ORDER — CYCLOBENZAPRINE HCL 10 MG PO TABS: 10.0000 mg | ORAL_TABLET | Freq: Three times a day (TID) | ORAL | Status: DC | PRN

## 2013-09-14 NOTE — Progress Notes (Signed)
  Jacqueline Henry - 58 y.o. female MRN [id number]  Date of birth: 1956-01-26  SUBJECTIVE:  Including CC & ROS.  Chief Complaint  Patient presents with  . Shoulder Pain    58 yo athletic female who presents with 1 week of worsening periscapular pain. She describes that pain as a tightness/soreness/spasm.  No particular inciting event, although she does do a lot of weight lifting. She has not done any upper body weight lifting this week 2/2 to the pain. The pain is worst first thing in the morning. Initially, it improved throughout the day but now persists during the day.  No particular motions reproduce the pain and she does not feel that her ROM is limited. She experienced relief of her pain after running. She experiences temporary relief with heat, ice, massage.  She has been taking celebrex 200 BID for the past 7 days with again temporary improvement (celebrex left over from back issues).   No prior injury or problems with this shoulder. No fevers, weight changes. No nighttime pain. No numbness, tingling or weakness in her arms. No neck pain.      HISTORY: Past Medical, Surgical, Social, and Family History Reviewed & Updated per EMR.   Pertinent Historical Findings include: Lumbar herniated disc Physical therapist  PHYSICAL EXAM:  VS: BP:118/77 mmHg  HR:58bpm  TEMP: ( )  RESP:   HT:5\' 5"  (165.1 cm)   WT:112 lb (50.803 kg)  BMI:18.7 PHYSICAL EXAM: Gen: well appearing, pleasant, muscular build Inspection: head sits anterior to torso; scapular dyskinesia on the left (catching motion when elevating arm superior) Palpation: no cervical SP or paraspinal muscle TTP; TTP with areas of focal hypertonicity and reproduction of pain in periscapular muscles (supraspinatus, trap, rhomboids) Strength: 5/5 BUE  ROM: full neck ROM without pain; slightly less ER of left shoulder compared to the right but otherwise full shoulder ROM without pain Special tests: negative spurling's; negative empty can,  negative hawkin's; rotator cuff intact Neurovascular: sensation and strength intact and equal BUE; warm and well perfused   ASSESSMENT & PLAN:  1. Trapezius muscle spasm (spasm of multiple right periscapular muscles, including supraspinatus, infraspinatus, trapezius and rhomboids) - suspect that she triggered this spasm during her weight training   Patient Instructions  Shake out exercises as demonstrated. 10-15 at a time throughout the day.  Scapular stabilization exercises, as demonstrated and provided in handout.   Flexeril for spasms, tramadol for pain. Both of these may make you drowsy.  Foam roller and tennis ball, as discussed.    Return in about 1 week (around 09/21/2013) for left peri-scapular pain/spasms.  Etheleen NicksSusannah Lichtenstein, DO, HO3  Reviewed and edited  Enid BaasKarl Breona Cherubin, MD

## 2013-09-14 NOTE — Patient Instructions (Signed)
Shake out exercises as demonstrated. 10-15 at a time throughout the day.  Scapular stabilization exercises, as demonstrated and provided in handout.   Flexeril for spasms, tramadol for pain. Both of these may make you drowsy.  Foam roller and tennis ball, as discussed.

## 2013-09-15 DIAGNOSIS — M546 Pain in thoracic spine: Secondary | ICD-10-CM | POA: Insufficient documentation

## 2013-09-15 NOTE — Assessment & Plan Note (Signed)
We will have her do easy HEP  Use flexeril qhs/  Add some tramadol to lessen pain  Reck 1 week  No evidence of cervical disk today

## 2013-09-21 ENCOUNTER — Ambulatory Visit (INDEPENDENT_AMBULATORY_CARE_PROVIDER_SITE_OTHER): Payer: 59 | Admitting: Sports Medicine

## 2013-09-21 ENCOUNTER — Encounter: Payer: Self-pay | Admitting: Sports Medicine

## 2013-09-21 VITALS — BP 109/78 | HR 66 | Ht 65.0 in | Wt 112.0 lb

## 2013-09-21 DIAGNOSIS — M546 Pain in thoracic spine: Secondary | ICD-10-CM

## 2013-09-21 NOTE — Assessment & Plan Note (Signed)
Much improved  Continue working on scapular stabilization exercises and and low weights  Avoid any lifts that create pain  Okay to stop medicines  Continue to work on posture and stretches to reposition the left scapula  We will see her as needed or if symptoms recur

## 2013-09-21 NOTE — Progress Notes (Signed)
Patient ID: Jacqueline Henry, female   DOB: 1955-09-01, 58 y.o.   MRN: [id number]  Patient returns in followup of acute thoracic back pain. She had winging of the left scapula. Lots of trapezius spasm. 3 days of Flexeril helped the spasm.  Tramadol did not seem to affect the pain much. She did chiropractic treatment with deep tissue release by Dr. Marylene Buergerodolpho.  This helped a lot as well.  She feels she is about 85% better. She is doing scapular rehabilitation exercises with no weight and they are not painful.  She is preparing for tripped annually next week and feels that she should be able to do this without difficulties.  We think the injury probably occurred from some activity in her weight lifting.  Physical examination No acute distress BP 109/78  Pulse 66  Ht 5\' 5"  (1.651 m)  Wt 112 lb (50.803 kg)  BMI 18.64 kg/m2  Bruising over the left trapezius but no spasm today Still with slight winging of the left scapula at rest Her scapular function on motion is much improved   Shoulder: Inspection reveals no abnormalities, atrophy or asymmetry. Palpation is normal with no tenderness over AC joint or bicipital groove. ROM is full in all planes. Rotator cuff strength normal throughout. No signs of impingement with negative Neer and Hawkin's tests, empty can. Speeds and Yergason's tests normal. No labral pathology noted with negative Obrien's, negative clunk and good stability. Normal scapular function observed. No painful arc and no drop arm sign. No apprehension sign  Neck motion is normal with no significant neurologic signs

## 2013-09-25 ENCOUNTER — Encounter: Payer: Self-pay | Admitting: Dietician

## 2013-09-25 ENCOUNTER — Encounter: Payer: 59 | Attending: Internal Medicine | Admitting: Dietician

## 2013-09-25 VITALS — Ht 65.0 in | Wt 113.9 lb

## 2013-09-25 DIAGNOSIS — E538 Deficiency of other specified B group vitamins: Secondary | ICD-10-CM | POA: Insufficient documentation

## 2013-09-25 DIAGNOSIS — Z713 Dietary counseling and surveillance: Secondary | ICD-10-CM | POA: Diagnosis present

## 2013-09-25 NOTE — Patient Instructions (Addendum)
-  Healthy fats: flaxseed, chia seeds, almonds and walnuts, fish, olive oil   -1600-1800 calories -180-200 grams carbohydrates -120-135 grams protein -50-56 grams of fat

## 2013-09-25 NOTE — Progress Notes (Signed)
  Medical Nutrition Therapy:  Appt start time: 0915 end time:  1000.  Assessment:  Primary concerns today: Jacqueline Henry is a self-referred patient here for reassurance regarding her macronutrient distribution. 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 She is at high risk for osteoporosis. She states she wants to make sure she is nutritionally supporting her workouts. Long-distance runner and cyclist. Wondering, am I eating enough and eating the right stuff? Weight remains consistent at 112-114 lbs. She works as the Environmental education officer at [employer]. If working out for more than an hour, adds "Scratch" (40g CHO) to water. Looking to gain muscle and lose fat to maintain her weight.     Tanita information:  Weight: 115 lbs Fat %: 21.9 Fat mass: 25 lbs FFM: 90 lbs TBW: 66 lbs   Preferred Learning Style:   No preference indicated   Learning Readiness:   Change in progress   MEDICATIONS: see list   DIETARY INTAKE:   Gym at 5-6:30am 5-6 days a week  24-hr recall:  B ( AM): 1/3 cup of oatmeal with blueberries and flaxseed, 1 cup egg whites, 1 cup spinach  Snk (8-8:15 AM): protein shake (Optum powder and water) and a carb ([place] sandwich or another bowl of oatmeal OR apple and almonds)  L (11-11:30 PM): 4-6 oz lean protein, green vegetable, and sweet potato/fruit/brown rice Snk ( PM): Quest protein bar OR apple and almonds  D ( PM): 4-6 ounces of salmon or other fish cooked in olive oil, 1/2 sweet potato, big salad with vegetables  Snk ( PM): none  Beverages: 1 gallon of water, hot tea with Stevia, occasional glass of wine, occasional diet coke   Estimated energy needs:  -1600-1800 calories -180-200 grams carbohydrates -120-135 grams protein -50-56 grams of fat   Progress Towards Goal(s):  In progress.   Nutritional Diagnosis:  No nutrition problem.    Intervention:  Nutrition counseling provided.  Teaching Method Utilized:   Auditory  Barriers to learning/adherence to lifestyle change: none  Demonstrated degree of understanding via:  Teach Back   Monitoring/Evaluation:  Dietary intake, exercise, and body weight in 2 month(s).

## 2013-10-16 ENCOUNTER — Other Ambulatory Visit: Payer: 59 | Admitting: Internal Medicine

## 2013-10-16 DIAGNOSIS — Z1329 Encounter for screening for other suspected endocrine disorder: Secondary | ICD-10-CM

## 2013-10-16 DIAGNOSIS — Z13228 Encounter for screening for other metabolic disorders: Secondary | ICD-10-CM

## 2013-10-16 DIAGNOSIS — Z13220 Encounter for screening for lipoid disorders: Secondary | ICD-10-CM

## 2013-10-16 DIAGNOSIS — Z130 Encounter for screening for diseases of the blood and blood-forming organs and certain disorders involving the immune mechanism: Secondary | ICD-10-CM

## 2013-10-16 DIAGNOSIS — Z0000 Encounter for general adult medical examination without abnormal findings: Secondary | ICD-10-CM

## 2013-10-16 LAB — CBC WITH DIFFERENTIAL/PLATELET
Basophils Absolute: 0.1 K/uL (ref 0.0–0.1)
Basophils Relative: 1 % (ref 0–1)
Eosinophils Absolute: 0.3 K/uL (ref 0.0–0.7)
Eosinophils Relative: 5 % (ref 0–5)
HCT: 47.1 % — ABNORMAL HIGH (ref 36.0–46.0)
Hemoglobin: 16.1 g/dL — ABNORMAL HIGH (ref 12.0–15.0)
Lymphocytes Relative: 26 % (ref 12–46)
Lymphs Abs: 1.4 K/uL (ref 0.7–4.0)
MCH: 28.3 pg (ref 26.0–34.0)
MCHC: 34.2 g/dL (ref 30.0–36.0)
MCV: 82.9 fL (ref 78.0–100.0)
Monocytes Absolute: 0.4 K/uL (ref 0.1–1.0)
Monocytes Relative: 8 % (ref 3–12)
Neutro Abs: 3.3 K/uL (ref 1.7–7.7)
Neutrophils Relative %: 60 % (ref 43–77)
Platelets: 190 K/uL (ref 150–400)
RBC: 5.68 MIL/uL — ABNORMAL HIGH (ref 3.87–5.11)
RDW: 14 % (ref 11.5–15.5)
WBC: 5.5 K/uL (ref 4.0–10.5)

## 2013-10-16 LAB — COMPREHENSIVE METABOLIC PANEL
ALT: 46 U/L — AB (ref 0–35)
AST: 93 U/L — AB (ref 0–37)
Albumin: 4.5 g/dL (ref 3.5–5.2)
Alkaline Phosphatase: 77 U/L (ref 39–117)
BILIRUBIN TOTAL: 1.2 mg/dL (ref 0.2–1.2)
BUN: 22 mg/dL (ref 6–23)
CHLORIDE: 103 meq/L (ref 96–112)
CO2: 24 mEq/L (ref 19–32)
CREATININE: 0.99 mg/dL (ref 0.50–1.10)
Calcium: 10 mg/dL (ref 8.4–10.5)
Glucose, Bld: 77 mg/dL (ref 70–99)
Potassium: 5 mEq/L (ref 3.5–5.3)
Sodium: 140 mEq/L (ref 135–145)
Total Protein: 7.1 g/dL (ref 6.0–8.3)

## 2013-10-16 LAB — LIPID PANEL
CHOL/HDL RATIO: 3 ratio
Cholesterol: 206 mg/dL — ABNORMAL HIGH (ref 0–200)
HDL: 69 mg/dL (ref >39)
LDL CALC: 121 mg/dL — AB (ref 0–99)
Triglycerides: 80 mg/dL (ref <150)
VLDL: 16 mg/dL (ref 0–40)

## 2013-10-16 LAB — TSH: TSH: 2.477 u[IU]/mL (ref 0.350–4.500)

## 2013-10-17 LAB — VITAMIN D 25 HYDROXY (VIT D DEFICIENCY, FRACTURES): VIT D 25 HYDROXY: 60 ng/mL (ref 30–89)

## 2013-10-19 ENCOUNTER — Ambulatory Visit (INDEPENDENT_AMBULATORY_CARE_PROVIDER_SITE_OTHER): Payer: 59 | Admitting: Internal Medicine

## 2013-10-19 ENCOUNTER — Encounter: Payer: Self-pay | Admitting: Internal Medicine

## 2013-10-19 VITALS — BP 102/72 | HR 60 | Ht 64.0 in | Wt 116.0 lb

## 2013-10-19 DIAGNOSIS — M810 Age-related osteoporosis without current pathological fracture: Secondary | ICD-10-CM

## 2013-10-19 DIAGNOSIS — R748 Abnormal levels of other serum enzymes: Secondary | ICD-10-CM

## 2013-10-19 DIAGNOSIS — E785 Hyperlipidemia, unspecified: Secondary | ICD-10-CM

## 2013-10-19 DIAGNOSIS — Z23 Encounter for immunization: Secondary | ICD-10-CM

## 2013-10-19 DIAGNOSIS — J45990 Exercise induced bronchospasm: Secondary | ICD-10-CM

## 2013-10-19 DIAGNOSIS — Z0000 Encounter for general adult medical examination without abnormal findings: Secondary | ICD-10-CM

## 2013-10-19 LAB — POCT URINALYSIS DIPSTICK
Bilirubin, UA: NEGATIVE
GLUCOSE UA: NEGATIVE
Ketones, UA: NEGATIVE
Leukocytes, UA: NEGATIVE
Nitrite, UA: NEGATIVE
PROTEIN UA: NEGATIVE
RBC UA: NEGATIVE
Spec Grav, UA: 1.01
UROBILINOGEN UA: NEGATIVE
pH, UA: 5

## 2013-11-24 ENCOUNTER — Other Ambulatory Visit: Payer: 59 | Admitting: Internal Medicine

## 2013-11-28 ENCOUNTER — Ambulatory Visit: Payer: 59 | Admitting: Dietician

## 2013-12-24 NOTE — Progress Notes (Signed)
Subjective:    Patient ID: Jacqueline Henry, female    DOB: October 27, 1955, 58 y.o.   MRN: [id number]  HPI  Pleasant 58 year old female in today for health maintenance exam. History of asthma which seems to be exercise-induced. Uses albuterol inhaler as needed. She is an avid runner. Has been diagnosed by Dr. Corliss Skainseveshwar with  Sjogren's disease.  Past medical history: Right knee arthroscopy proximally 1998 by Dr. Cleophas DunkerWhitfield. 5 eye surgeries between 1959 and 1962. Patient had colonoscopy by Dr. Odetta PinkB Chaney September 2010 at [facility]. History of vitamin B-12 deficiency and osteoporosis. Was started on Fosamax January 2013.  No known drug allergies. Patient is intolerant of codeine and Darvon  Social history: She is married. Husband is retired and has had prostate cancer. She is employed by [employer] and is an Clinical biochemistexecutive for innovation. No children. 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 She has a Event organiserMasters degree. Does not smoke or consume alcohol. She runs about 20 miles a week, lifts weights 2 times a week and bikes one to 2 times a week.  Family history: Father died with history of colon cancer and heart disease. Mother in her 7380s with history of hypertension and hyperlipidemia. One brother in good health. One sister with history of obesity heart disease and breast cancer.    Review of Systems  Constitutional: Negative.   HENT:       Dry eyes.  Respiratory:       History of exercise-induced asthma  Cardiovascular: Negative.   Gastrointestinal: Negative.   Genitourinary: Negative.   Neurological: Negative.   Hematological: Negative.   Psychiatric/Behavioral: Negative.        Objective:   Physical Exam  Constitutional: She is oriented to person, place, and time. She appears well-developed and well-nourished. No distress.  HENT:  Head: Normocephalic and atraumatic.  Right Ear: External ear normal.  Left Ear: External ear normal.    Mouth/Throat: Oropharynx is clear and moist. No oropharyngeal exudate.  Eyes: Conjunctivae and EOM are normal. Pupils are equal, round, and reactive to light. Right eye exhibits no discharge. Left eye exhibits no discharge. No scleral icterus.  Neck: Neck supple. No JVD present. No thyromegaly present.  Cardiovascular: Normal rate, regular rhythm, normal heart sounds and intact distal pulses.   No murmur heard. Pulmonary/Chest: Effort normal and breath sounds normal. No respiratory distress. She has no wheezes. She has no rales.  Breasts normal female  Abdominal: Bowel sounds are normal. She exhibits no distension and no mass. There is no tenderness. There is no rebound and no guarding.  No hepatomegaly  Genitourinary:  Bimanual normal. Pap taken 2014  Musculoskeletal: Normal range of motion. She exhibits no edema.  Lymphadenopathy:    She has no cervical adenopathy.  Neurological: She is alert and oriented to person, place, and time. She has normal reflexes. No cranial nerve deficit. Coordination normal.  Skin: Skin is warm and dry. No rash noted. She is not diaphoretic.  Psychiatric: She has a normal mood and affect. Her behavior is normal. Judgment and thought content normal.  Vitals reviewed.         Assessment & Plan:  Osteoporosis-placed on Fosamax January 2013. Takes vitamin D and calcium. Order for bone density study given  History of exercise-induced asthma-uses as necessary albuterol inhaler  Elevated liver enzymes-could be due to viral etiology or recent alcohol consumption. Patient doesn't take a lot of anti-inflammatory medications. Suggest we repeat in 3 months.  History of  Sjogren's disease  History of B-12 deficiency  Family history of colon cancer in father

## 2013-12-24 NOTE — Patient Instructions (Signed)
Return in December to repeat liver studies. Bone density order given.

## 2014-04-09 ENCOUNTER — Ambulatory Visit (INDEPENDENT_AMBULATORY_CARE_PROVIDER_SITE_OTHER): Payer: 59 | Admitting: Internal Medicine

## 2014-04-09 ENCOUNTER — Encounter: Payer: Self-pay | Admitting: Internal Medicine

## 2014-04-09 VITALS — BP 104/68 | HR 60 | Temp 97.9°F | Ht 65.0 in | Wt 115.0 lb

## 2014-04-09 DIAGNOSIS — R1084 Generalized abdominal pain: Secondary | ICD-10-CM

## 2014-04-09 DIAGNOSIS — R5383 Other fatigue: Secondary | ICD-10-CM

## 2014-04-09 LAB — CBC WITH DIFFERENTIAL/PLATELET
Basophils Absolute: 0.1 10*3/uL (ref 0.0–0.1)
Basophils Relative: 1 % (ref 0–1)
EOS ABS: 0.2 10*3/uL (ref 0.0–0.7)
EOS PCT: 3 % (ref 0–5)
HEMATOCRIT: 47.5 % — AB (ref 36.0–46.0)
Hemoglobin: 15.6 g/dL — ABNORMAL HIGH (ref 12.0–15.0)
Lymphocytes Relative: 20 % (ref 12–46)
Lymphs Abs: 1.3 10*3/uL (ref 0.7–4.0)
MCH: 28.8 pg (ref 26.0–34.0)
MCHC: 32.8 g/dL (ref 30.0–36.0)
MCV: 87.6 fL (ref 78.0–100.0)
MONOS PCT: 5 % (ref 3–12)
MPV: 11.1 fL (ref 8.6–12.4)
Monocytes Absolute: 0.3 10*3/uL (ref 0.1–1.0)
Neutro Abs: 4.5 10*3/uL (ref 1.7–7.7)
Neutrophils Relative %: 71 % (ref 43–77)
Platelets: 220 10*3/uL (ref 150–400)
RBC: 5.42 MIL/uL — ABNORMAL HIGH (ref 3.87–5.11)
RDW: 13.6 % (ref 11.5–15.5)
WBC: 6.4 10*3/uL (ref 4.0–10.5)

## 2014-04-09 LAB — POCT URINALYSIS DIPSTICK
BILIRUBIN UA: NEGATIVE
GLUCOSE UA: NEGATIVE
KETONES UA: NEGATIVE
Leukocytes, UA: NEGATIVE
Nitrite, UA: NEGATIVE
Protein, UA: NEGATIVE
RBC UA: NEGATIVE
Urobilinogen, UA: NEGATIVE
pH, UA: 6

## 2014-04-09 LAB — T4, FREE: Free T4: 1.17 ng/dL (ref 0.80–1.80)

## 2014-04-09 LAB — COMPLETE METABOLIC PANEL WITH GFR
ALT: 16 U/L (ref 0–35)
AST: 18 U/L (ref 0–37)
Albumin: 4.7 g/dL (ref 3.5–5.2)
Alkaline Phosphatase: 81 U/L (ref 39–117)
BUN: 29 mg/dL — ABNORMAL HIGH (ref 6–23)
CO2: 26 mEq/L (ref 19–32)
CREATININE: 1.01 mg/dL (ref 0.50–1.10)
Calcium: 9.8 mg/dL (ref 8.4–10.5)
Chloride: 101 mEq/L (ref 96–112)
GFR, EST AFRICAN AMERICAN: 71 mL/min
GFR, Est Non African American: 62 mL/min
Glucose, Bld: 80 mg/dL (ref 70–99)
Potassium: 4.8 mEq/L (ref 3.5–5.3)
SODIUM: 138 meq/L (ref 135–145)
Total Bilirubin: 0.9 mg/dL (ref 0.2–1.2)
Total Protein: 7.3 g/dL (ref 6.0–8.3)

## 2014-04-09 LAB — TSH: TSH: 2.313 u[IU]/mL (ref 0.350–4.500)

## 2014-04-09 NOTE — Progress Notes (Signed)
   Subjective:    Patient ID: Jacqueline Henry, female    DOB: 09/18/1955, 59 y.o.   MRN: [id number]  HPI  Patient has not felt well for several weeks. Her energy level has not been very good. She's had some diffuse myalgias. Has some right shoulder pain. She's had some vague epigastric distress with reflux symptoms. No right upper quadrant abdominal pain. No fever or chills. In January she had her husband did take a cruise to the [place]. No diarrhea. Continues to try to exercise. Has had some bilateral arm pain.    Review of Systems     Objective:   Physical Exam  Skin warm and dry. Nodes none. Chest is clear to auscultation. Cardiac exam regular rate and rhythm. Abdomen: no hepatosplenomegaly masses or significant tenderness. No swollen joints.      Assessment & Plan:  Epigastric distress  [place] travel-cruise  Musculoskeletal pain  History of Sjogren syndrome  Plan: H pylori test. CBC with differential, C met, sedimentation rate, ANA, free T4, TSH. Ultrasound of upper abdomen

## 2014-04-09 NOTE — Addendum Note (Signed)
Addended by: Thomasena EdisWILLIAMS, Jimmylee Ratterree N on: 04/09/2014 11:59 AM   Modules accepted: Orders

## 2014-04-10 LAB — SEDIMENTATION RATE: Sed Rate: 1 mm/hr (ref 0–30)

## 2014-04-10 LAB — CYCLIC CITRUL PEPTIDE ANTIBODY, IGG: Cyclic Citrullin Peptide Ab: <2 U/mL (ref 0.0–5.0)

## 2014-04-10 LAB — ANA: Anti Nuclear Antibody(ANA): NEGATIVE

## 2014-04-11 LAB — H. PYLORI BREATH TEST: H. pylori Breath Test: NOT DETECTED

## 2014-04-13 ENCOUNTER — Telehealth: Payer: Self-pay | Admitting: Internal Medicine

## 2014-04-13 ENCOUNTER — Ambulatory Visit (HOSPITAL_BASED_OUTPATIENT_CLINIC_OR_DEPARTMENT_OTHER)
Admission: RE | Admit: 2014-04-13 | Discharge: 2014-04-13 | Disposition: A | Discharge status: Home / Self Care | Payer: 59 | Source: Ambulatory Visit | Attending: Internal Medicine | Admitting: Internal Medicine

## 2014-04-13 DIAGNOSIS — R140 Abdominal distension (gaseous): Secondary | ICD-10-CM | POA: Diagnosis present

## 2014-04-13 DIAGNOSIS — R1084 Generalized abdominal pain: Secondary | ICD-10-CM

## 2014-04-13 DIAGNOSIS — R1013 Epigastric pain: Secondary | ICD-10-CM | POA: Insufficient documentation

## 2014-04-13 MED ORDER — METRONIDAZOLE 500 MG PO TABS: 500.0000 mg | ORAL_TABLET | Freq: Two times a day (BID) | ORAL | Status: DC

## 2014-04-13 MED ORDER — CIPROFLOXACIN HCL 500 MG PO TABS: 500.0000 mg | ORAL_TABLET | Freq: Two times a day (BID) | ORAL | Status: DC

## 2014-04-13 NOTE — Telephone Encounter (Signed)
Patient going this afternoon to have ultrasound of the gallbladder. She's feeling about the same. Talked with her about lab results which are normal. H pylori test is negative. We are going to call in Cipro 500 mg twice daily for 7 days and Flagyl 500 mg twice daily for 7 days.

## 2014-04-14 ENCOUNTER — Ambulatory Visit (HOSPITAL_BASED_OUTPATIENT_CLINIC_OR_DEPARTMENT_OTHER): Payer: 59

## 2014-04-16 ENCOUNTER — Telehealth: Payer: Self-pay | Admitting: *Deleted

## 2014-04-26 NOTE — Telephone Encounter (Signed)
Patient has access to medical results on MY Chart

## 2014-05-28 ENCOUNTER — Other Ambulatory Visit: Payer: Self-pay | Admitting: Internal Medicine

## 2014-05-28 DIAGNOSIS — Z1231 Encounter for screening mammogram for malignant neoplasm of breast: Secondary | ICD-10-CM

## 2014-06-22 ENCOUNTER — Encounter: Payer: Self-pay | Admitting: Internal Medicine

## 2014-07-03 ENCOUNTER — Ambulatory Visit (HOSPITAL_COMMUNITY)
Admission: RE | Admit: 2014-07-03 | Discharge: 2014-07-03 | Disposition: A | Discharge status: Home / Self Care | Payer: 59 | Source: Ambulatory Visit | Attending: Internal Medicine | Admitting: Internal Medicine

## 2014-07-03 DIAGNOSIS — Z0000 Encounter for general adult medical examination without abnormal findings: Secondary | ICD-10-CM

## 2014-07-03 DIAGNOSIS — Z1231 Encounter for screening mammogram for malignant neoplasm of breast: Secondary | ICD-10-CM | POA: Diagnosis present

## 2014-07-03 DIAGNOSIS — M810 Age-related osteoporosis without current pathological fracture: Secondary | ICD-10-CM

## 2014-07-16 ENCOUNTER — Encounter: Payer: Self-pay | Admitting: Internal Medicine

## 2014-07-16 ENCOUNTER — Ambulatory Visit (INDEPENDENT_AMBULATORY_CARE_PROVIDER_SITE_OTHER): Payer: 59 | Admitting: Internal Medicine

## 2014-07-16 VITALS — BP 108/64 | HR 61 | Temp 98.0°F | Wt 116.0 lb

## 2014-07-16 DIAGNOSIS — M810 Age-related osteoporosis without current pathological fracture: Secondary | ICD-10-CM

## 2014-07-16 MED ORDER — FLUTICASONE-SALMETEROL 500-50 MCG/DOSE IN AEPB: 1.0000 | INHALATION_SPRAY | Freq: Two times a day (BID) | RESPIRATORY_TRACT | Status: DC

## 2014-07-16 MED ORDER — ALENDRONATE SODIUM 70 MG PO TABS: 70.0000 mg | ORAL_TABLET | ORAL | Status: DC

## 2014-07-16 NOTE — Patient Instructions (Signed)
To start Fosamax weekly. Reevaluate with bone density study in one year. Call if does not tolerate Fosamax.

## 2014-07-31 NOTE — Progress Notes (Signed)
   Subjective:    Patient ID: Jacqueline Henry, female    DOB: 1955-09-07, 59 y.o.   MRN: [id number]  HPI Patient in today at my request to discuss results of recent bone density study. She has a Family history of osteoporosis. She is very physically active. Likes running, hiking, biking.  Had been treating osteopenia with vitamin D supplementation and calcium. However recently she had another bone density study May 31 showing a T score in the spine of -2.8 and in the femoral neck of -2.1. Spine T score previously was -2.5 in 2013. Left femoral neck T score in 2013  was -1.8. Therefore, there has been significant bone loss in the past 3 years.  No history of fractures.    Review of Systems     Objective:   Physical Exam  Not examined. Have been 15 minutes speaking with her about treatment options. I think it would be wise to start Fosamax 70 mg weekly. If she is unable to tolerate that because of GI side effects, then we can consider Prolia. She will continue vitamin D supplementation. She is agreeable to trying Fosamax weekly. Advised to take on empty stomach 1 hour before meal.     Assessment & Plan:  Osteoporosis- lumbar spine  Osteopenia-left femoral neck  Plan: Trial of Fosamax 70 mg weekly. Reassess bone density in one year.

## 2014-12-05 ENCOUNTER — Ambulatory Visit (INDEPENDENT_AMBULATORY_CARE_PROVIDER_SITE_OTHER): Payer: 59 | Admitting: Internal Medicine

## 2014-12-05 VITALS — Temp 100.0°F | Resp 12 | Wt 116.0 lb

## 2014-12-05 DIAGNOSIS — J029 Acute pharyngitis, unspecified: Secondary | ICD-10-CM | POA: Diagnosis not present

## 2014-12-05 LAB — POCT RAPID STREP A (OFFICE): Rapid Strep A Screen: NEGATIVE

## 2014-12-05 MED ORDER — LEVOFLOXACIN 500 MG PO TABS: ORAL_TABLET | ORAL | Status: DC

## 2014-12-05 MED ORDER — HYDROCODONE-HOMATROPINE 5-1.5 MG/5ML PO SYRP: 5.0000 mL | ORAL_SOLUTION | Freq: Three times a day (TID) | ORAL | Status: DC | PRN

## 2015-01-01 ENCOUNTER — Encounter: Payer: Self-pay | Admitting: Internal Medicine

## 2015-01-01 NOTE — Patient Instructions (Signed)
Take Levaquin 500 milligrams daily for 7 days.

## 2015-01-01 NOTE — Progress Notes (Signed)
   Subjective:    Patient ID: Jacqueline Henry, female    DOB: 1955-03-06, 59 y.o.   MRN: [id number]  HPI Patient called complaining of sore throat, malaise and fatigue with fever. She was seen urgently today.    Review of Systems     Objective:   Physical Exam  Pharynx is red without exudate. Rapid strep screen is negative. TMs are clear. Neck supple. Chest clear to auscultation without rales or wheezing      Assessment & Plan:  Non-strep pharyngitis  Plan: Levaquin 500 milligrams daily for 7 days.

## 2015-08-19 ENCOUNTER — Other Ambulatory Visit: Payer: Self-pay | Admitting: Internal Medicine

## 2015-08-19 DIAGNOSIS — Z1231 Encounter for screening mammogram for malignant neoplasm of breast: Secondary | ICD-10-CM

## 2015-09-02 ENCOUNTER — Ambulatory Visit
Admission: RE | Admit: 2015-09-02 | Discharge: 2015-09-02 | Disposition: A | Discharge status: Home / Self Care | Payer: 59 | Source: Ambulatory Visit | Attending: Internal Medicine | Admitting: Internal Medicine

## 2015-09-02 DIAGNOSIS — Z1231 Encounter for screening mammogram for malignant neoplasm of breast: Secondary | ICD-10-CM

## 2015-09-17 DIAGNOSIS — H52222 Regular astigmatism, left eye: Secondary | ICD-10-CM | POA: Diagnosis not present

## 2015-09-17 DIAGNOSIS — H53001 Unspecified amblyopia, right eye: Secondary | ICD-10-CM | POA: Diagnosis not present

## 2015-09-17 DIAGNOSIS — H5212 Myopia, left eye: Secondary | ICD-10-CM | POA: Diagnosis not present

## 2015-10-23 MED FILL — IBUPROFEN 800 MG TABLET: 800 | 4 days supply | Qty: 16 | Fill #0

## 2015-10-23 MED FILL — traMADol HCL 50 MG TABS: 50 | 2 days supply | Qty: 10 | Fill #0

## 2015-10-23 MED FILL — AMOXICILLIN 500 MG CAPSULE: 500 | 5 days supply | Qty: 25 | Fill #0

## 2016-02-25 ENCOUNTER — Ambulatory Visit (INDEPENDENT_AMBULATORY_CARE_PROVIDER_SITE_OTHER): Payer: 59 | Admitting: Sports Medicine

## 2016-02-25 ENCOUNTER — Encounter: Payer: Self-pay | Admitting: Sports Medicine

## 2016-02-25 ENCOUNTER — Ambulatory Visit: Payer: Self-pay

## 2016-02-25 VITALS — BP 136/89 | Ht 65.0 in | Wt 115.0 lb

## 2016-02-25 DIAGNOSIS — M84375A Stress fracture, left foot, initial encounter for fracture: Secondary | ICD-10-CM | POA: Diagnosis not present

## 2016-02-25 DIAGNOSIS — M79672 Pain in left foot: Secondary | ICD-10-CM

## 2016-02-25 DIAGNOSIS — M8000XA Age-related osteoporosis with current pathological fracture, unspecified site, initial encounter for fracture: Secondary | ICD-10-CM

## 2016-02-25 NOTE — Progress Notes (Signed)
CC:  Left foot pain  [place] in [place] vacation 10 days ago Lots of standing Normally runs 15 to 20 MPW as well as weights and biking Foot became painful When she used supportive shoes she had less pain but walking more on outside of left foot Over past 5 days progressively more pain Swells daily and worse toward evening  Past Hx:  Known osteopenia/ osteoporosis Fosamax Ca and Vit D  ROS No ankle pain No pain in rt foot No numbness into foot Hurts at rest and with standing  PE Pleasant, fit F in NAD BP 136/89   Ht 5\' 5"  (1.651 m)   Wt 115 lb (52.2 kg)   BMI 19.14 kg/m   Left foot shows swelling that is generalized over dorsum MT 4 and 5 non tender MT 1 non tender MT 2 and 3 with TTP and very sensitive to light pressure This is mid to distal third and worse on MT 2 Plantar -  No pain or TTP No discoloration  US of Left foot There is a hypoechoic area at mid left 2nd MT There is cortical irregularity noted in same region long and short axis views MTP joints are normal On oblique vies more obvious area of hypoechoic change in soft tissue Probably cortical disruption along medial border  Summary:  Ultrasound findings consistent with 2nd MT stress fracture left foot; no clear stress reaction over MT 3  Ultrasound and interpretation by Royal HawthornKarl B. Darrick PennaFields, MD

## 2016-02-25 NOTE — Progress Notes (Signed)
Past due for CPE and needs bone density study last was 2016 and she was prescribed Fosamax then. Please call her.

## 2016-02-25 NOTE — Assessment & Plan Note (Signed)
This likely contributed to stress fracture  I think her exercise and meds have kept her stable to this point

## 2016-02-25 NOTE — Assessment & Plan Note (Signed)
Start with arch strap Keep up meds and Ca and Vit D Use post op shoe  OK to cross train but limit anything causing foot pain  Repeat US and exam in 2 weeks

## 2016-02-26 ENCOUNTER — Telehealth: Payer: Self-pay | Admitting: Internal Medicine

## 2016-02-26 ENCOUNTER — Encounter: Payer: Self-pay | Admitting: Internal Medicine

## 2016-02-26 DIAGNOSIS — M84376A Stress fracture, unspecified foot, initial encounter for fracture: Secondary | ICD-10-CM

## 2016-02-26 NOTE — Progress Notes (Signed)
Called patient @ [phone] and left message for her that we needed to schedule CPE and Bone Density for her as we had not done since 2016 (Bone Density).  Left options available to her and ask that she call me back to set up time for her CPE.  Will send Bone Density order over so that can be scheduled for her.

## 2016-02-26 NOTE — Telephone Encounter (Signed)
Seen by Dr Roanna EpleyBert Fields with stress fracture of foot. Needs bone density study and CPE in near future. Message left for pt. Order placed for bone density study

## 2016-02-27 ENCOUNTER — Ambulatory Visit: Payer: 59 | Admitting: Sports Medicine

## 2016-02-27 NOTE — Progress Notes (Signed)
Spoke with patient this morning and scheduled CPE Labs for 3/13 and CPE for 3/19 @ 2pm.  Faxed order for Bone Density over [facility].  They will call patient to schedule.  Patient will get this done prior to CPE.

## 2016-03-12 ENCOUNTER — Other Ambulatory Visit: Payer: Self-pay

## 2016-03-12 ENCOUNTER — Ambulatory Visit (INDEPENDENT_AMBULATORY_CARE_PROVIDER_SITE_OTHER): Payer: 59 | Admitting: Sports Medicine

## 2016-03-12 ENCOUNTER — Encounter: Payer: Self-pay | Admitting: Sports Medicine

## 2016-03-12 ENCOUNTER — Encounter: Payer: Self-pay | Admitting: Internal Medicine

## 2016-03-12 DIAGNOSIS — M84375G Stress fracture, left foot, subsequent encounter for fracture with delayed healing: Secondary | ICD-10-CM

## 2016-03-12 MED ORDER — IBANDRONATE SODIUM 150 MG PO TABS
150.0000 mg | ORAL_TABLET | ORAL | 0 refills | Status: DC
Start: 2016-03-12 — End: 2016-03-12

## 2016-03-12 MED ORDER — IBANDRONATE SODIUM 150 MG PO TABS: 150.0000 mg | ORAL_TABLET | ORAL | 0 refills | Status: DC

## 2016-03-12 MED FILL — IBANDRONATE NA 150 MG TAB: 150 | 90 days supply | Qty: 3 | Fill #0

## 2016-03-12 NOTE — Assessment & Plan Note (Signed)
This appears improved and is probably at expected level of healing  Known low bone density may delay healing so we are expected 6 weeks  Cont in post op shoe until we see true callus  Repeat scan 4 wkks on RTC

## 2016-03-12 NOTE — Progress Notes (Signed)
  Jacqueline Henry - 61 y.o. female MRN [id number]  Date of birth: [birth year]  SUBJECTIVE:  Including CC & ROS.  No chief complaint on file. [name] is a 61 yo F w/ PMH of osteoporosis w/ recent h/o metatarsal stress fracture in for F/U. Pt presented 2 weeks ago w/ fracture of distal aspect of 2nd metatarsal on L. Foot visible on US. Pt placed in post op shoe at that time. Continues to describe intermittent "sharp" 7/10 pain over dorsum of foot. Reports decreased swelling/pain w/ ice, arch support, and 400mg  ibuprofen BID.   ROS- as per HPI   HISTORY: Past Medical, Surgical, Social, and Family History Reviewed & Updated per EMR.   Pertinent Historical Findings include: Osteoporosis  DATA REVIEWED: US L. Foot (03/13/15): Cortical thickening w/ hypervascularity overlying 2nd metatarsal. Intact MTP. No stress reaction visible over 3rd metatarsal.  Note she has + sonopalpation sign over this area.  PHYSICAL EXAM:  VS: BP:120/85  HR:63bpm  TEMP: ( )  RESP:   HT:5\' 5"  (165.1 cm)   WT:118 lb (53.5 kg)  BMI:19.7 PHYSICAL EXAM:  General: well-appearing athletic F in NAD, A&Ox3  MSK: - mild soft tissue swelling on dorsum of foot w/o bruising - full ROM to dorsiflexion/plantarflexion - PTP over 2nd/3rd metatarsal - No PTP over plantar surface  ASSESSMENT & PLAN: See problem based charting & AVS for pt instructions. 61 yo F w/ PMH of osteoporosis w/ pain and stress fracture of distal 2nd metatarsal confirmed by US. Stress fracture shows signs of hypervascularity. Note the swelling is less than on last visit and her pain is improved. Given osteoporosis, may expect 6 wks for recovery.   1. Continue use of surgical boot 2. Cross training as tolerated 3. F/U in 4 wks to assess progress

## 2016-03-12 NOTE — Telephone Encounter (Signed)
See e-mail response. Try Boniva and see if tolerated.

## 2016-04-10 ENCOUNTER — Encounter: Payer: Self-pay | Admitting: Internal Medicine

## 2016-04-14 ENCOUNTER — Other Ambulatory Visit: Payer: 59 | Admitting: Internal Medicine

## 2016-04-14 DIAGNOSIS — Z1321 Encounter for screening for nutritional disorder: Secondary | ICD-10-CM

## 2016-04-14 DIAGNOSIS — M810 Age-related osteoporosis without current pathological fracture: Secondary | ICD-10-CM | POA: Diagnosis not present

## 2016-04-14 DIAGNOSIS — Z1329 Encounter for screening for other suspected endocrine disorder: Secondary | ICD-10-CM

## 2016-04-14 DIAGNOSIS — Z0000 Encounter for general adult medical examination without abnormal findings: Secondary | ICD-10-CM

## 2016-04-14 DIAGNOSIS — Z13220 Encounter for screening for lipoid disorders: Secondary | ICD-10-CM | POA: Diagnosis not present

## 2016-04-14 LAB — COMPREHENSIVE METABOLIC PANEL
ALK PHOS: 65 U/L (ref 33–130)
ALT: 22 U/L (ref 6–29)
AST: 25 U/L (ref 10–35)
Albumin: 4.7 g/dL (ref 3.6–5.1)
BUN: 23 mg/dL (ref 7–25)
CALCIUM: 9.9 mg/dL (ref 8.6–10.4)
CO2: 26 mmol/L (ref 20–31)
CREATININE: 1.02 mg/dL — AB (ref 0.50–0.99)
Chloride: 103 mmol/L (ref 98–110)
Glucose, Bld: 76 mg/dL (ref 65–99)
Potassium: 4.7 mmol/L (ref 3.5–5.3)
Sodium: 139 mmol/L (ref 135–146)
Total Bilirubin: 1.2 mg/dL (ref 0.2–1.2)
Total Protein: 7.4 g/dL (ref 6.1–8.1)

## 2016-04-14 LAB — CBC WITH DIFFERENTIAL/PLATELET
Basophils Absolute: 0 cells/uL (ref 0–200)
Basophils Relative: 0 %
Eosinophils Absolute: 306 cells/uL (ref 15–500)
Eosinophils Relative: 6 %
HEMATOCRIT: 46.5 % — AB (ref 35.0–45.0)
Hemoglobin: 15.6 g/dL — ABNORMAL HIGH (ref 11.7–15.5)
LYMPHS ABS: 1377 {cells}/uL (ref 850–3900)
LYMPHS PCT: 27 %
MCH: 29.4 pg (ref 27.0–33.0)
MCHC: 33.5 g/dL (ref 32.0–36.0)
MCV: 87.6 fL (ref 80.0–100.0)
MONO ABS: 408 {cells}/uL (ref 200–950)
MPV: 11.4 fL (ref 7.5–12.5)
Monocytes Relative: 8 %
Neutro Abs: 3009 cells/uL (ref 1500–7800)
Neutrophils Relative %: 59 %
Platelets: 183 10*3/uL (ref 140–400)
RBC: 5.31 MIL/uL — ABNORMAL HIGH (ref 3.80–5.10)
RDW: 13.6 % (ref 11.0–15.0)
WBC: 5.1 10*3/uL (ref 3.8–10.8)

## 2016-04-14 LAB — TSH: TSH: 2.03 m[IU]/L

## 2016-04-14 LAB — LIPID PANEL
Cholesterol: 245 mg/dL — ABNORMAL HIGH (ref <200)
HDL: 80 mg/dL (ref >50)
LDL Cholesterol: 155 mg/dL — ABNORMAL HIGH (ref <100)
Total CHOL/HDL Ratio: 3.1 Ratio (ref <5.0)
Triglycerides: 52 mg/dL (ref <150)
VLDL: 10 mg/dL (ref <30)

## 2016-04-15 LAB — VITAMIN D 25 HYDROXY (VIT D DEFICIENCY, FRACTURES): Vit D, 25-Hydroxy: 50 ng/mL (ref 30–100)

## 2016-04-20 ENCOUNTER — Ambulatory Visit (INDEPENDENT_AMBULATORY_CARE_PROVIDER_SITE_OTHER): Payer: 59 | Admitting: Internal Medicine

## 2016-04-20 ENCOUNTER — Encounter: Payer: Self-pay | Admitting: Internal Medicine

## 2016-04-20 ENCOUNTER — Other Ambulatory Visit (HOSPITAL_COMMUNITY)
Admission: RE | Admit: 2016-04-20 | Discharge: 2016-04-20 | Disposition: A | Discharge status: Home / Self Care | Payer: 59 | Source: Ambulatory Visit | Attending: Internal Medicine | Admitting: Internal Medicine

## 2016-04-20 VITALS — BP 100/68 | HR 65 | Temp 97.8°F | Ht 63.5 in | Wt 124.0 lb

## 2016-04-20 DIAGNOSIS — Z01419 Encounter for gynecological examination (general) (routine) without abnormal findings: Secondary | ICD-10-CM | POA: Insufficient documentation

## 2016-04-20 DIAGNOSIS — Z0000 Encounter for general adult medical examination without abnormal findings: Secondary | ICD-10-CM

## 2016-04-20 DIAGNOSIS — Z8739 Personal history of other diseases of the musculoskeletal system and connective tissue: Secondary | ICD-10-CM | POA: Diagnosis not present

## 2016-04-20 DIAGNOSIS — E7800 Pure hypercholesterolemia, unspecified: Secondary | ICD-10-CM

## 2016-04-20 DIAGNOSIS — Z124 Encounter for screening for malignant neoplasm of cervix: Secondary | ICD-10-CM | POA: Diagnosis not present

## 2016-04-20 DIAGNOSIS — M84375G Stress fracture, left foot, subsequent encounter for fracture with delayed healing: Secondary | ICD-10-CM

## 2016-04-20 LAB — POCT URINALYSIS DIPSTICK
BILIRUBIN UA: NEGATIVE
GLUCOSE UA: NEGATIVE
KETONES UA: NEGATIVE
Leukocytes, UA: NEGATIVE
Nitrite, UA: NEGATIVE
Protein, UA: NEGATIVE
RBC UA: NEGATIVE
Spec Grav, UA: 1.005 (ref 1.030–1.035)
Urobilinogen, UA: 0.2 (ref ?–2.0)
pH, UA: 6 (ref 5.0–8.0)

## 2016-04-20 MED ORDER — ALBUTEROL SULFATE HFA 108 (90 BASE) MCG/ACT IN AERS: 2.0000 | INHALATION_SPRAY | Freq: Four times a day (QID) | RESPIRATORY_TRACT | 99 refills | Status: DC | PRN

## 2016-04-21 ENCOUNTER — Ambulatory Visit (INDEPENDENT_AMBULATORY_CARE_PROVIDER_SITE_OTHER): Payer: 59 | Admitting: Sports Medicine

## 2016-04-21 ENCOUNTER — Encounter: Payer: Self-pay | Admitting: Sports Medicine

## 2016-04-21 DIAGNOSIS — M84375G Stress fracture, left foot, subsequent encounter for fracture with delayed healing: Secondary | ICD-10-CM | POA: Diagnosis not present

## 2016-04-21 NOTE — Assessment & Plan Note (Signed)
With hard callus she can resume normal activities Practice some activities standing on the left foot until this is no longer painful Use pain as her guide and gradually progress back to running  I think the slower healing process is likely related to osteoporosis and agree with her plan tostart treatment  She will return to see me if needed

## 2016-04-21 NOTE — Progress Notes (Signed)
F/u : Left second metatarsal stress fracture  Patient has improved significantly In terms of pain She is two weeks out of a boot She can walk without pain in normal shoes She can bike without pain  She still has pain if she tries to stand on the toes of that foot She gets pain with pushoff No running yet  Her primary care physician has suggested she go on Boniva as she has osteoporosis by bone density and now by fracture history  Review of systems No numbness over the feet No swelling in the right foot No pain in other bones or joints  Physical examination Physically fit female in no acute distress BP 126/80   Pulse (!) 58   Ht 5' 4.25" (1.632 m) Comment: measured in office  Wt 122 lb (55.3 kg)   BMI 20.78 kg/m   There is still minimal swelling over the distal left second metatarsal There is no palpable tenderness There is a palpable bump distally that may be callus Good movement of the toe without pain Standing without pain  Ultrasound of left second metatarsal She has evidence of hard callus bridging the fracture of the second metatarsal On transverse scan the callus does not appear to be completely ossified Minimal soft tissue swelling She still has increased Doppler activity  Impression: healing second metatarsal stress fracture distal shaft with hard callus  Ultrasound and interpretation by Sibyl ParrKarl B. Darrick PennaFields, MD

## 2016-04-23 LAB — CYTOLOGY - PAP: Diagnosis: NEGATIVE

## 2016-04-28 ENCOUNTER — Ambulatory Visit (INDEPENDENT_AMBULATORY_CARE_PROVIDER_SITE_OTHER): Payer: 59 | Admitting: Orthopaedic Surgery

## 2016-04-28 ENCOUNTER — Encounter (INDEPENDENT_AMBULATORY_CARE_PROVIDER_SITE_OTHER): Payer: Self-pay | Admitting: Orthopaedic Surgery

## 2016-04-28 ENCOUNTER — Ambulatory Visit (INDEPENDENT_AMBULATORY_CARE_PROVIDER_SITE_OTHER): Payer: Self-pay

## 2016-04-28 VITALS — BP 156/97 | HR 77 | Resp 14 | Ht 65.0 in | Wt 120.0 lb

## 2016-04-28 DIAGNOSIS — M25561 Pain in right knee: Secondary | ICD-10-CM | POA: Diagnosis not present

## 2016-04-28 MED ORDER — METHYLPREDNISOLONE ACETATE 40 MG/ML IJ SUSP
80.0000 mg | INTRAMUSCULAR | Status: AC | PRN
  Administered 2016-04-28: 80 mg

## 2016-04-28 MED ORDER — LIDOCAINE HCL 1 % IJ SOLN
5.0000 mL | INTRAMUSCULAR | Status: AC | PRN
  Administered 2016-04-28: 5 mL

## 2016-04-28 MED ORDER — BUPIVACAINE HCL 0.5 % IJ SOLN
3.0000 mL | INTRAMUSCULAR | Status: AC | PRN
  Administered 2016-04-28: 3 mL via INTRA_ARTICULAR

## 2016-04-28 NOTE — Progress Notes (Signed)
Office Visit Note   Patient: Jacqueline Henry           Date of Birth: Oct 05, 1955           MRN: [id number] Visit Date: 04/28/2016              Requested by: Margaree Mackintosh, MD [address] [place], [place] [address] PCP: Margaree Mackintosh, MD   Assessment & Plan: Visit Diagnosis: Chondromalacia with lateral patellar pressure syndrome  Plan: Local cortisone injection about lateral patella, Pennsaid gel, follow-up as needed and resume activities as tolerated  Follow-Up Instructions: No Follow-up on file.   Orders:  No orders of the defined types were placed in this encounter.  No orders of the defined types were placed in this encounter.     Procedures: Large Joint Inj Date/Time: 04/28/2016 1:50 PM Performed by: Valeria Batman Authorized by: Valeria Batman   Consent Given by:  Patient Timeout: prior to procedure the correct patient, procedure, and site was verified   Indications:  Pain and joint swelling Location:  Knee Site:  R knee Prep: patient was prepped and draped in usual sterile fashion   Needle Size:  25 G Needle Length:  1.5 inches Approach:  Anteromedial Ultrasound Guidance: No   Fluoroscopic Guidance: No   Arthrogram: No   Medications:  5 mL lidocaine 1 %; 80 mg methylPREDNISolone acetate 40 MG/ML; 3 mL bupivacaine 0.5 % Aspiration Attempted: No   Patient tolerance:  Patient tolerated the procedure well with no immediate complications     Clinical Data: No additional findings.   Subjective: Chief Complaint  Patient presents with  . Right Foot - Injury    Jacqueline Henry is a 61 year old female that injured her right knee on March 16 at the gym when she was doing core work on the floor and stood up. She then felt the pain, swelling and ice/heat. After 48 hrs. 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 She relates if there is any incline when walking, she  has pain laterally.   Pain is exacerbated with inclines or stairs. The pain is mostly along the lateral aspect of her knee and closer to the patella than the joint line. She think she had some swelling about the knee initially but not sure she actually had an effusion. Jacqueline Henry oftentimes feels like she has to walk " to the side"she denies true locking but has had some catching.  Review of Systems   Objective: Vital Signs: There were no vitals taken for this visit.  Physical Exam  Ortho Exam right knee without effusion. Considerable patellar crepitation with flexion and extension. Mild lateral joint pain; none medially. No instability with a varus or valgus stress. Negative anterior drawer sign. No skin skin changes. No calf pain. No swelling distally. Neurovascular exam intact. No popliteal pain.Most pain palpable along the lateral patella     Imaging: No results found.   PMFS History: Patient Active Problem List   Diagnosis Date Noted  . Metatarsal stress fracture of left foot 02/25/2016  . Acute thoracic back pain 09/15/2013  . Osteoporosis 01/29/2013  . Other malaise and fatigue 06/15/2012  . Low back pain 05/05/2012  . SICCA SYNDROME 05/20/2009  . VITAMIN B12 DEFICIENCY 12/05/2008  . OSTEOARTHRITIS, KNEE, LEFT 02/09/2008  . ASTHMA, INTERMITTENT, MILD 08/17/2007  . HALLUX VALGUS, ACQUIRED 10/08/2006   Past Medical History:  Diagnosis Date  . Sjogren's disease (HCC)     Family History  Problem Relation Age of Onset  . Hypertension Mother   . Hyperlipidemia Father   . Heart attack Father   . Diabetes Neg Hx   . Sudden death Neg Hx   . Cancer Other     Past Surgical History:  Procedure Laterality Date  . KNEE SURGERY Left ~2000   arthroscopy   Social History   Occupational History  . Not on file.   Social History Main Topics  . Smoking status: Never Smoker  . Smokeless tobacco: Never Used  . Alcohol use Yes     Comment: socially  . Drug use: No  . Sexual  activity: Not on file

## 2016-04-29 MED FILL — VENTOLIN HFA 90 MCG INHALER: 108 (90 BAS | 25 days supply | Qty: 18 | Fill #0

## 2016-05-01 NOTE — Patient Instructions (Addendum)
It was a pleasure to see you today. Please try Boniva monthly and see if it is tolerated. To have bone density study in June. Return for fasting lipid panel in 6 months. Lipids are likely to return to normal once her activity increases after foot heals

## 2016-05-01 NOTE — Progress Notes (Signed)
   Subjective:    Patient ID: Jacqueline Henry, female    DOB: February 25, 1955, 61 y.o.   MRN: [id number]  HPI Pleasant 61 year old Female for health maintenance exam and evaluation of medical issues. Recently sustained a metatarsal fracture and was seen by Dr. Roanna Epley. History of osteoporosis. Did not tolerate Fosamax. Caused GI symptoms. We have had a discussion about this today and she will try Boniva. She's not sure she wants to take any medication for osteoporosis. She is worried about osteonecrosis of the jaw. Is very active with working out, cycling and running.  Patient had distal second metatarsal fracture left foot late January. Was put in a postop shoe by Dr. Darrick Penna. She had been in [place] on vacation with lots of standing. Generally runs 15-20 miles per week as well as biking and weight lifting. Foot became painful.  Had colonoscopy 2016 by Dr. Matthias Hughs. Mammogram July 2017.  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 She has worked at [employer] for many years and very skip acid ease. Trained as a Adult nurse. Nonsmoker. Social alcohol consumption.    Review of Systems  Constitutional: Negative.   Respiratory: Negative.   Cardiovascular: Negative.   Genitourinary: Negative.   Musculoskeletal:       Still with some foot pain status post fracture  Neurological: Negative.        Objective:   Physical Exam  Constitutional: She is oriented to person, place, and time. She appears well-developed and well-nourished. No distress.  HENT:  Head: Normocephalic and atraumatic.  Right Ear: External ear normal.  Left Ear: External ear normal.  Mouth/Throat: Oropharynx is clear and moist. No oropharyngeal exudate.  Eyes: Conjunctivae and EOM are normal. Pupils are equal, round, and reactive to light. Right eye exhibits no discharge. Left eye exhibits no discharge. No scleral icterus.  Neck: Neck supple. No JVD  present. No thyromegaly present.  Cardiovascular: Normal rate, regular rhythm, normal heart sounds and intact distal pulses.   No murmur heard. Pulmonary/Chest: No respiratory distress. She has no wheezes. She has no rales.  Abdominal: She exhibits no distension and no mass. There is no tenderness. There is no rebound and no guarding.  Genitourinary:  Genitourinary Comments: Pap taken. Bimanual normal.  Musculoskeletal: She exhibits no edema.  Lymphadenopathy:    She has no cervical adenopathy.  Neurological: She is alert and oriented to person, place, and time. No cranial nerve deficit. Coordination normal.  Skin: Skin is warm and dry. No rash noted. She is not diaphoretic.  Psychiatric: She has a normal mood and affect. Her behavior is normal. Judgment and thought content normal.  Vitals reviewed.         Assessment & Plan:  Normal health maintenance exam-received flu vaccine through employment  History of recent Second metatarsal fracture left foot  Osteoporosis-trial of Boniva monthly. Did not tolerate Fosamax due to nausea and GI upset to have bone density study in June.  Hyperlipidemia. LDL cholesterol has increased from 121-155 likely due to inactivity with foot fracture  Total cholesterol has increased from 206-245 likely for the same reason. HDL cholesterol has actually increased from 69-80. Triglycerides are within normal limits.   Suggest we recheck fasting lipid panel in 6 months once she becomes more active and healed from foot fracture

## 2016-05-21 ENCOUNTER — Other Ambulatory Visit: Payer: Self-pay | Admitting: Obstetrics & Gynecology

## 2016-05-21 MED ORDER — NITROFURANTOIN MONOHYD MACRO 100 MG PO CAPS: 100.0000 mg | ORAL_CAPSULE | Freq: Two times a day (BID) | ORAL | 0 refills | Status: DC

## 2016-05-21 MED FILL — NITROFURANTOIN MONO-MCR 100: 100 | 5 days supply | Qty: 10 | Fill #0

## 2016-05-21 NOTE — Progress Notes (Signed)
Pt having UTI symptoms for 1 day--urgency, frequency.  No fever.  No back pain.  Unable to leave urine sample due to work schedule.  No history of urinary tract abnormalities, antibiotic resisitance, recent hospitalizations.  Will treat empirically with Macrobid.  If symptoms not improving in 24 hours, patient will need to leave urine sample for culture.

## 2016-05-29 ENCOUNTER — Encounter: Payer: Self-pay | Admitting: Internal Medicine

## 2016-05-29 ENCOUNTER — Ambulatory Visit (INDEPENDENT_AMBULATORY_CARE_PROVIDER_SITE_OTHER): Payer: 59 | Admitting: Internal Medicine

## 2016-05-29 VITALS — BP 108/80 | Ht 65.0 in | Wt 123.0 lb

## 2016-05-29 DIAGNOSIS — R350 Frequency of micturition: Secondary | ICD-10-CM

## 2016-05-29 DIAGNOSIS — N390 Urinary tract infection, site not specified: Secondary | ICD-10-CM | POA: Diagnosis not present

## 2016-05-29 MED ORDER — CIPROFLOXACIN HCL 500 MG PO TABS
500.0000 mg | ORAL_TABLET | Freq: Two times a day (BID) | ORAL | 0 refills | Status: DC
Start: 2016-05-29 — End: 2016-06-04

## 2016-05-29 MED FILL — CIPROFLOXACIN HCL 500 MG TA: 500 | 7 days supply | Qty: 14 | Fill #0

## 2016-05-29 NOTE — Progress Notes (Signed)
   Subjective:    Patient ID: Jacqueline Henry, female    DOB: 17-Oct-1955, 61 y.o.   MRN: [id number]  HPI Patient says that approximately 2 weeks ago around April 11, she had urinary frequency and urgency.  Our office was closed on the afternoon of April 11 and she was leaving for [place] the following day. Dr. Penne Lash wrote her prescription for nitrofurantoin. She took this prescription for several days and symptoms improved.  However,  urinary symptoms have returned. Has seen no blood in urine. No fever or shaking chills.  Urine  dipstick is completely within normal limits with no LE, nitrite or occult blood. However her urine is quite dilute. Culture was sent.  Patient had similar symptoms in 2014 and urine dipstick was negative at that time. She was felt to have urethral right as and was treated with Cipro. Culture showed 3000 colonies per milliliter insignificant growth.    Review of Systems see above     Objective:   Physical Exam She has no CVA tenderness. Culture sent.       Assessment & Plan:  Urethritis versus UTI  Plan: Cipro 500 mg twice daily for 7 days. She requests prescription for Herpes zoster vaccine. Says husband has Herpes zoster at the present time. Patient  had chickenpox as a child.

## 2016-05-29 NOTE — Patient Instructions (Signed)
Cipro 500 mg twice daily for 7 days. Urine culture pending. Prescription given for herpes zoster vaccine.

## 2016-05-31 LAB — URINE CULTURE

## 2016-06-01 ENCOUNTER — Encounter: Payer: Self-pay | Admitting: Internal Medicine

## 2016-06-01 LAB — POCT URINALYSIS DIPSTICK
BILIRUBIN UA: NEGATIVE
Blood, UA: NEGATIVE
Glucose, UA: NEGATIVE
Ketones, UA: NEGATIVE
Leukocytes, UA: NEGATIVE
Nitrite, UA: NEGATIVE
Protein, UA: NEGATIVE
SPEC GRAV UA: 1.01 (ref 1.010–1.025)
Urobilinogen, UA: 0.2 E.U./dL
pH, UA: 6 (ref 5.0–8.0)

## 2016-06-04 ENCOUNTER — Ambulatory Visit (INDEPENDENT_AMBULATORY_CARE_PROVIDER_SITE_OTHER): Payer: 59 | Admitting: Internal Medicine

## 2016-06-04 ENCOUNTER — Telehealth: Payer: Self-pay | Admitting: Internal Medicine

## 2016-06-04 ENCOUNTER — Encounter: Payer: Self-pay | Admitting: Internal Medicine

## 2016-06-04 ENCOUNTER — Other Ambulatory Visit: Payer: Self-pay | Admitting: Ophthalmology

## 2016-06-04 VITALS — BP 122/62 | HR 62 | Temp 97.0°F | Wt 122.0 lb

## 2016-06-04 DIAGNOSIS — R350 Frequency of micturition: Secondary | ICD-10-CM

## 2016-06-04 DIAGNOSIS — R233 Spontaneous ecchymoses: Secondary | ICD-10-CM | POA: Diagnosis not present

## 2016-06-04 DIAGNOSIS — R300 Dysuria: Secondary | ICD-10-CM | POA: Diagnosis not present

## 2016-06-04 LAB — POCT URINALYSIS DIPSTICK
Bilirubin, UA: NEGATIVE
Blood, UA: NEGATIVE
Glucose, UA: NEGATIVE
Ketones, UA: NEGATIVE
Leukocytes, UA: NEGATIVE
Nitrite, UA: NEGATIVE
PH UA: 5 (ref 5.0–8.0)
PROTEIN UA: NEGATIVE
SPEC GRAV UA: 1.01 (ref 1.010–1.025)
Urobilinogen, UA: 0.2 E.U./dL

## 2016-06-04 NOTE — Patient Instructions (Signed)
CBC with differential pending. To have CT of abdomen and pelvis with stone protocol tomorrow

## 2016-06-04 NOTE — Telephone Encounter (Addendum)
Patient called; asked to speak with you; advised you're with patients.  Has petechia that started this morning from her knees down.  She has had about 2-3 different nurses look at them.  They said she needs to have her doctor look at them.  They are not itching and they do not seem to be an allergic reaction of any sort.  She IS continuing to be symptomatic from the UTI that she was seen for last week.  Still having issues.    She said if you want to see her, she will be more than happy to come over this afternoon.    Her cell # is [phone].    Thank you.    Called pt. Come to office at 4 pm.

## 2016-06-04 NOTE — Progress Notes (Signed)
   Subjective:    Patient ID: Jacqueline Henry, female    DOB: [birth year], 61 y.o.   MRN: [id number]  HPI  61 year old Female who was seen here on April 27 with history of onset of urinary frequency on April 11. She was treated with Macrobid by another physician and improved. Later went [place] but urinary symptoms had returned when she was seen. She complained of pressure almost like menstrual cramping. She had no blood in her urine at the time of urine dipstick. Culture was sent that was negative. She has seen no blood in her urine. No back pain. No fever or shaking chills.  She has some tiny macular erythematous lesions on her lower legs almost in the pattern of folliculitis. They do not easily blanch. Some nurses looked at them and thought they were petechiae.  Review of Systems see above     Objective:   Physical Exam  No CVA tenderness. Dipstick UA today is normal.      Assessment & Plan:  Lower abdominal pressure with urinary frequency up to 4 times a night.? Stone  Plan: Order CT of abdomen and pelvis with stone protocol. CBC with differential pending.

## 2016-06-05 ENCOUNTER — Ambulatory Visit (INDEPENDENT_AMBULATORY_CARE_PROVIDER_SITE_OTHER): Payer: 59 | Admitting: Internal Medicine

## 2016-06-05 ENCOUNTER — Encounter: Payer: Self-pay | Admitting: Internal Medicine

## 2016-06-05 ENCOUNTER — Ambulatory Visit (HOSPITAL_BASED_OUTPATIENT_CLINIC_OR_DEPARTMENT_OTHER)
Admission: RE | Admit: 2016-06-05 | Discharge: 2016-06-05 | Disposition: A | Discharge status: Home / Self Care | Payer: 59 | Source: Ambulatory Visit | Attending: Internal Medicine | Admitting: Internal Medicine

## 2016-06-05 VITALS — BP 102/68 | HR 57 | Temp 97.7°F | Wt 120.0 lb

## 2016-06-05 DIAGNOSIS — R233 Spontaneous ecchymoses: Secondary | ICD-10-CM | POA: Diagnosis not present

## 2016-06-05 DIAGNOSIS — R350 Frequency of micturition: Secondary | ICD-10-CM | POA: Insufficient documentation

## 2016-06-05 DIAGNOSIS — R300 Dysuria: Secondary | ICD-10-CM | POA: Diagnosis not present

## 2016-06-05 DIAGNOSIS — R109 Unspecified abdominal pain: Secondary | ICD-10-CM | POA: Diagnosis not present

## 2016-06-05 LAB — CBC WITH DIFFERENTIAL/PLATELET
BASOS PCT: 1 %
Basophils Absolute: 56 cells/uL (ref 0–200)
EOS PCT: 5 %
Eosinophils Absolute: 280 cells/uL (ref 15–500)
HEMATOCRIT: 42.9 % (ref 35.0–45.0)
HEMOGLOBIN: 13.9 g/dL (ref 11.7–15.5)
LYMPHS ABS: 1456 {cells}/uL (ref 850–3900)
Lymphocytes Relative: 26 %
MCH: 28.4 pg (ref 27.0–33.0)
MCHC: 32.4 g/dL (ref 32.0–36.0)
MCV: 87.6 fL (ref 80.0–100.0)
MPV: 11.6 fL (ref 7.5–12.5)
Monocytes Absolute: 392 cells/uL (ref 200–950)
Monocytes Relative: 7 %
NEUTROS PCT: 61 %
Neutro Abs: 3416 cells/uL (ref 1500–7800)
Platelets: 160 10*3/uL (ref 140–400)
RBC: 4.9 MIL/uL (ref 3.80–5.10)
RDW: 14 % (ref 11.0–15.0)
WBC: 5.6 10*3/uL (ref 3.8–10.8)

## 2016-06-05 NOTE — Progress Notes (Signed)
   Subjective:    Patient ID: Jacqueline Henry, female    DOB: [birth year], 61 y.o.   MRN: [id number]  HPI 61 year old Female in today for follow-up at my request after having a CT today to check for renal stone. CT is negative for renal stone and no evidence of pelvic mass. Still has frequency and cramping sensation suprapubic  area.    Review of Systems     Objective:   Physical Exam   Still has petechial lesions along hair follicles of lower legs. No other rashes or systemic symptoms. CBC revealed platelet count of 160,000. Hgb 13.9 g and WBC 5600.       Assessment & Plan:  I am wondering if rash on legs is a reaction to Cipro. She has completed 7 day course of Cipro--- wait and she if this resolves  ? Interstitial cystitis vs bladder spasms. Pt will take OTC Azpstandard and see Urologist next week.

## 2016-06-05 NOTE — Patient Instructions (Signed)
Keep appointment with urologist. May take Azo-Standard over-the-counter for discomfort. See if petechiae on legs resolve after stopping Cipro.

## 2016-06-10 DIAGNOSIS — R350 Frequency of micturition: Secondary | ICD-10-CM | POA: Diagnosis not present

## 2016-06-10 DIAGNOSIS — N3281 Overactive bladder: Secondary | ICD-10-CM | POA: Diagnosis not present

## 2016-07-13 ENCOUNTER — Ambulatory Visit
Admission: RE | Admit: 2016-07-13 | Discharge: 2016-07-13 | Disposition: A | Discharge status: Home / Self Care | Payer: 59 | Source: Ambulatory Visit | Attending: Internal Medicine | Admitting: Internal Medicine

## 2016-07-13 DIAGNOSIS — M84376A Stress fracture, unspecified foot, initial encounter for fracture: Secondary | ICD-10-CM

## 2016-07-13 DIAGNOSIS — Z780 Asymptomatic menopausal state: Secondary | ICD-10-CM | POA: Diagnosis not present

## 2016-07-13 DIAGNOSIS — M8589 Other specified disorders of bone density and structure, multiple sites: Secondary | ICD-10-CM | POA: Diagnosis not present

## 2016-07-20 ENCOUNTER — Encounter: Payer: Self-pay | Admitting: Internal Medicine

## 2016-07-20 ENCOUNTER — Other Ambulatory Visit: Payer: Self-pay | Admitting: Internal Medicine

## 2016-07-20 MED ORDER — IBANDRONATE SODIUM 150 MG PO TABS: 150.0000 mg | ORAL_TABLET | ORAL | 2 refills | Status: DC

## 2016-07-20 MED FILL — IBANDRONATE NA 150 MG TAB: 150 | 90 days supply | Qty: 3 | Fill #0

## 2016-07-20 NOTE — Progress Notes (Signed)
Refilled generic Boniva 150 mg once a month #3 tablets with 2 refills

## 2016-08-11 ENCOUNTER — Encounter: Payer: Self-pay | Admitting: Internal Medicine

## 2016-08-18 ENCOUNTER — Ambulatory Visit: Payer: Self-pay

## 2016-08-18 ENCOUNTER — Other Ambulatory Visit: Payer: 59 | Admitting: Internal Medicine

## 2016-08-18 ENCOUNTER — Encounter: Payer: Self-pay | Admitting: Sports Medicine

## 2016-08-18 ENCOUNTER — Ambulatory Visit (INDEPENDENT_AMBULATORY_CARE_PROVIDER_SITE_OTHER): Payer: 59 | Admitting: Sports Medicine

## 2016-08-18 VITALS — BP 96/56 | Ht 64.5 in | Wt 122.0 lb

## 2016-08-18 DIAGNOSIS — M25561 Pain in right knee: Secondary | ICD-10-CM

## 2016-08-18 DIAGNOSIS — M25562 Pain in left knee: Secondary | ICD-10-CM

## 2016-08-18 DIAGNOSIS — E7800 Pure hypercholesterolemia, unspecified: Secondary | ICD-10-CM

## 2016-08-18 DIAGNOSIS — M23351 Other meniscus derangements, posterior horn of lateral meniscus, right knee: Secondary | ICD-10-CM | POA: Diagnosis not present

## 2016-08-18 LAB — LIPID PANEL
CHOLESTEROL: 192 mg/dL (ref <200)
HDL: 53 mg/dL (ref >50)
LDL Cholesterol: 121 mg/dL — ABNORMAL HIGH (ref <100)
Total CHOL/HDL Ratio: 3.6 Ratio (ref <5.0)
Triglycerides: 88 mg/dL (ref <150)
VLDL: 18 mg/dL (ref <30)

## 2016-08-18 NOTE — Assessment & Plan Note (Signed)
use a compression sleeve Icing as needed Anti-inflammatory medication as needed  Stopped running and switched to biking She should give this  6 weeks of rest and see if it will heal with conservative care

## 2016-08-18 NOTE — Progress Notes (Signed)
Injury to right knee March 16  Acute injury twisting while getting up at gym Since then right knee pain This stops her from running Not bad with swimming or biking Feels tight  Pain with walking down hills or steps Hurts more posterior and around lateral calf  Saw DR Cleophas DunkerWhitfield 3/27 XR OK CSI helped for 2 weeks  Review of systems Feels somewhat unstable with twisting to the outside No locking No giving way  Physical exam Thin, athletic female in no acute distress BP (!) 96/56   Ht 5' 4.5" (1.638 m)   Wt 122 lb (55.3 kg)   BMI 20.62 kg/m   Knee: Right Normal to inspection with no erythema or effusion or obvious bony abnormalities. Palpation normal with no warmth or joint line tenderness or patellar tenderness or condyle tenderness. ROM normal in flexion and extension and lower leg rotation. However, even that she has 140 of flexion this is tight and 20 less than the left Ligaments with solid consistent endpoints including ACL, PCL, LCL, MCL. Negative Mcmurray's and provocative meniscal tests. Non painful patellar compression. Patellar and quadriceps tendons unremarkable. Hamstring and quadriceps strength is normal. Posterior lateral corner is somewhat painful to deep pressure or palpation  Ultrasound of right knee  There is a moderate effusion in the suprapatellar pouch The medial meniscus was unremarkable with good joint space Lateral meniscus shows some linear splitting There is hypoechoic change surrounding the popliteus tendon as well as an area with a small split Posterior lateral meniscus shows a significant degenerative split with a local effusion and some mild calcifications in the posterior lateral corner  Impression - ultrasound is consistent with a posterior lateral meniscus tear that is probably degenerative along withmoderate effusion and some popliteus tendinopathy  Ultrasound and interpretation by Sibyl ParrKarl B. Darrick PennaFields, MD

## 2016-09-08 ENCOUNTER — Ambulatory Visit: Payer: 59 | Admitting: Sports Medicine

## 2016-09-15 ENCOUNTER — Ambulatory Visit (INDEPENDENT_AMBULATORY_CARE_PROVIDER_SITE_OTHER): Payer: 59 | Admitting: Sports Medicine

## 2016-09-15 DIAGNOSIS — M23351 Other meniscus derangements, posterior horn of lateral meniscus, right knee: Secondary | ICD-10-CM | POA: Diagnosis not present

## 2016-09-15 NOTE — Progress Notes (Signed)
F/u lateral meniscus tear  Injury to RT knee March 16, seen in clinic on 7/17 and diagnosed with lateral meniscus tear   Has not been running Has given out a few times walking down steps, was doing this on flat ground previously but not now is not  Hurts more posterior and around lateral calf  No pain with biking or regular walking  Saw DR Cleophas DunkerWhitfield 3/27 XR OK CSI helped for 2 weeks  Review of systems Feels somewhat unstable with twisting to the outside No locking No giving way Night pain has resolved  Physical exam Thin, athletic female in no acute distress BP 106/68   Ht 5\' 5"  (1.651 m)   Wt 118 lb (53.5 kg)   BMI 19.64 kg/m   Knee: Right Normal to inspection with no erythema or effusion or obvious bony abnormalities. Palpation normal with no warmth or joint line tenderness or patellar tenderness or condyle tenderness. ROM normal in flexion and extension and lower leg rotation. However, even that she has 140 of flexion this is tight and 20 less than the left Ligaments with solid consistent endpoints including ACL, PCL, LCL, MCL. Negative Mcmurray's and provocative meniscal tests. Non painful patellar compression. Patellar and quadriceps tendons unremarkable. Hamstring and quadriceps strength is normal. Posterior lateral corner is somewhat painful to deep pressure or palpation  Ultrasound of right knee  There is a small effusion in the suprapatellar pouch This is much reduced from last visit The medial meniscus was unremarkable with good joint space Lateral meniscus shows some linear splitting There is hypoechoic change surrounding the popliteus tendon as well as an area with a small split but much less than last visit Posterior lateral meniscus shows a significant degenerative split with a local effusion and some mild calcifications in the posterior lateral corner; the area of swelling and pseudo-meniscal cystic change has resolved,  Impression - ultrasound is  consistent with a posterior lateral meniscus tear that is probably degenerative along with small effusion and some popliteus tendinopathy.  Overall this looks improved.  Ultrasound and interpretation by Sibyl ParrKarl B. Darrick PennaFields, MD

## 2016-09-15 NOTE — Assessment & Plan Note (Signed)
Good improvement with biking an conservative care  I strongly encouraged her to stay the course as long as improving and no instability  Keep up biking as key rehab  Reck 6 weeks

## 2016-10-26 ENCOUNTER — Other Ambulatory Visit: Payer: Self-pay | Admitting: Internal Medicine

## 2016-10-26 DIAGNOSIS — Z1231 Encounter for screening mammogram for malignant neoplasm of breast: Secondary | ICD-10-CM

## 2016-10-27 ENCOUNTER — Ambulatory Visit: Payer: 59

## 2016-11-13 DIAGNOSIS — H53001 Unspecified amblyopia, right eye: Secondary | ICD-10-CM | POA: Diagnosis not present

## 2016-11-13 DIAGNOSIS — H524 Presbyopia: Secondary | ICD-10-CM | POA: Diagnosis not present

## 2016-11-13 DIAGNOSIS — H5213 Myopia, bilateral: Secondary | ICD-10-CM | POA: Diagnosis not present

## 2016-11-13 DIAGNOSIS — H52222 Regular astigmatism, left eye: Secondary | ICD-10-CM | POA: Diagnosis not present

## 2016-11-19 ENCOUNTER — Ambulatory Visit (INDEPENDENT_AMBULATORY_CARE_PROVIDER_SITE_OTHER): Payer: 59 | Admitting: Sports Medicine

## 2016-11-19 ENCOUNTER — Encounter: Payer: Self-pay | Admitting: Sports Medicine

## 2016-11-19 VITALS — BP 120/80 | Ht 65.0 in | Wt 118.0 lb

## 2016-11-19 DIAGNOSIS — M25561 Pain in right knee: Secondary | ICD-10-CM

## 2016-11-19 NOTE — Patient Instructions (Signed)
MRI is next Thursday 11/26/16 at [facility]pm [facility].   [facility] in the [facility] at 445pm.  [phone] is their number if you need to call and reschedule.

## 2016-11-19 NOTE — Progress Notes (Signed)
   HPI  CC: follow-up right knee pain Patient is presenting today to follow-up with her right sided knee pain. She states that she is not significantly better from previous visits. She endorses persistent lateral knee pain. She denies any recent setbacks, falls, trauma, or injuries. She endorses some feelings of swelling and stiffness after persistent and prolonged exercise.  Medications/Interventions Tried: conservative measures  See HPI and/or previous note for associated ROS.  Objective: BP 120/80   Ht 5\' 5"  (1.651 m)   Wt 118 lb (53.5 kg)   BMI 19.64 kg/m  Gen: NAD, well groomed, a/o x3, normal affect.  CV: Well-perfused. Warm.  Resp: Non-labored.  Neuro: Sensation intact throughout. No gross coordination deficits.  Gait: Nonpathologic posture, unremarkable stride without signs of limp or balance issues. Knee, Right: TTP noted at the lateral JL. Inspection was negative for erythema, ecchymosis, and effusion. No obvious bony abnormalities or signs of osteophyte development. Palpation yielded no asymmetric warmth; No condyle tenderness; No patellar tenderness; 0/+1 bilateral patellar crepitus. Patellar and quadriceps tendons unremarkable, and no tenderness of the pes anserine bursa. No obvious Baker's cyst development. ROM normal in flexion (135 degrees) and extension (0 degrees). Normal hamstring and quadriceps strength. Neurovascularly intact bilaterally.  - Ligaments: (Solid and consistent endpoints)   - ACL (present bilaterally)   - PCL (present bilaterally)   - LCL (present bilaterally)   - MCL (present bilaterally).   - Meniscus:   - McMurray's: Positive for discomfort  - Patella:   - Patellar grind/compression: NEG   - Patellar glide: Without apprehension  ULTRASOUND: Right Knee  Quick, limited diagnostic ultrasound obtained of patient's Right Knee.  - moderate suprapatellar joint effusion noted - Lateral meniscus with hetero-echoic changes, concerning for  tearing/splitting. - Joint space seemingly well preserved.   Assessment and plan:  Right knee pain Patient is presenting with persistent right-sided knee pain. Presentation and ultrasound concerning for lateral meniscus tear. - MRI ordered today. - continue home exercise as tolerated, use pain as guide. - We'll discuss further follow-up once MRI results are back.   Orders Placed This Encounter  Procedures  . MR Knee Right Wo Contrast    Standing Status:   Future    Standing Expiration Date:   01/19/2018    Order Specific Question:   What is the patient's sedation requirement?    Answer:   No Sedation    Order Specific Question:   Does the patient have a pacemaker or implanted devices?    Answer:   No    Order Specific Question:   Preferred imaging location?    Answer:   [facility] (table limit-500 lbs)    Order Specific Question:   Radiology Contrast Protocol - do NOT remove file path    Answer:   [email or web address]    Order Specific Question:   Reason for Exam additional comments    Answer:   knee effusion and possible lateral meniscal tear    Kathee DeltonIan D Zaleigh Bermingham, MD,MS [facility] Fellow 11/22/2016 5:53 PM

## 2016-11-20 ENCOUNTER — Ambulatory Visit
Admission: RE | Admit: 2016-11-20 | Discharge: 2016-11-20 | Disposition: A | Discharge status: Home / Self Care | Payer: 59 | Source: Ambulatory Visit | Attending: Internal Medicine | Admitting: Internal Medicine

## 2016-11-20 DIAGNOSIS — Z1231 Encounter for screening mammogram for malignant neoplasm of breast: Secondary | ICD-10-CM

## 2016-11-22 DIAGNOSIS — M25561 Pain in right knee: Secondary | ICD-10-CM | POA: Insufficient documentation

## 2016-11-22 NOTE — Assessment & Plan Note (Signed)
Patient is presenting with persistent right-sided knee pain. Presentation and ultrasound concerning for lateral meniscus tear. - MRI ordered today. - continue home exercise as tolerated, use pain as guide. - We'll discuss further follow-up once MRI results are back.

## 2016-11-24 ENCOUNTER — Other Ambulatory Visit: Payer: Self-pay | Admitting: Sports Medicine

## 2016-11-24 DIAGNOSIS — M25561 Pain in right knee: Secondary | ICD-10-CM

## 2016-11-26 ENCOUNTER — Ambulatory Visit (HOSPITAL_COMMUNITY)
Admission: RE | Admit: 2016-11-26 | Discharge: 2016-11-26 | Disposition: A | Discharge status: Home / Self Care | Payer: 59 | Source: Ambulatory Visit | Attending: Sports Medicine | Admitting: Sports Medicine

## 2016-11-26 DIAGNOSIS — M25461 Effusion, right knee: Secondary | ICD-10-CM | POA: Insufficient documentation

## 2016-11-26 DIAGNOSIS — M25561 Pain in right knee: Secondary | ICD-10-CM | POA: Diagnosis not present

## 2016-11-26 DIAGNOSIS — R9389 Abnormal findings on diagnostic imaging of other specified body structures: Secondary | ICD-10-CM | POA: Diagnosis not present

## 2016-12-01 ENCOUNTER — Encounter: Payer: Self-pay | Admitting: Sports Medicine

## 2016-12-14 MED FILL — VENTOLIN HFA 90 MCG INHALER: 108 (90 BAS | 25 days supply | Qty: 18 | Fill #1

## 2016-12-21 ENCOUNTER — Encounter (INDEPENDENT_AMBULATORY_CARE_PROVIDER_SITE_OTHER): Payer: Self-pay

## 2016-12-21 ENCOUNTER — Encounter: Payer: Self-pay | Admitting: Internal Medicine

## 2016-12-21 ENCOUNTER — Ambulatory Visit (INDEPENDENT_AMBULATORY_CARE_PROVIDER_SITE_OTHER): Payer: 59 | Admitting: Internal Medicine

## 2016-12-21 VITALS — BP 116/70 | HR 62 | Temp 97.5°F | Wt 121.0 lb

## 2016-12-21 DIAGNOSIS — K219 Gastro-esophageal reflux disease without esophagitis: Secondary | ICD-10-CM | POA: Diagnosis not present

## 2016-12-21 DIAGNOSIS — F439 Reaction to severe stress, unspecified: Secondary | ICD-10-CM

## 2016-12-21 DIAGNOSIS — K209 Esophagitis, unspecified without bleeding: Secondary | ICD-10-CM

## 2016-12-21 DIAGNOSIS — R0989 Other specified symptoms and signs involving the circulatory and respiratory systems: Secondary | ICD-10-CM

## 2016-12-21 DIAGNOSIS — R198 Other specified symptoms and signs involving the digestive system and abdomen: Secondary | ICD-10-CM

## 2016-12-21 DIAGNOSIS — F458 Other somatoform disorders: Secondary | ICD-10-CM

## 2016-12-21 LAB — CBC WITH DIFFERENTIAL/PLATELET
BASOS ABS: 62 {cells}/uL (ref 0–200)
Basophils Relative: 0.8 %
EOS ABS: 169 {cells}/uL (ref 15–500)
Eosinophils Relative: 2.2 %
HCT: 45.1 % — ABNORMAL HIGH (ref 35.0–45.0)
HEMOGLOBIN: 15.2 g/dL (ref 11.7–15.5)
Lymphs Abs: 1317 cells/uL (ref 850–3900)
MCH: 28.7 pg (ref 27.0–33.0)
MCHC: 33.7 g/dL (ref 32.0–36.0)
MCV: 85.3 fL (ref 80.0–100.0)
MPV: 11.2 fL (ref 7.5–12.5)
Monocytes Relative: 6.3 %
NEUTROS ABS: 5667 {cells}/uL (ref 1500–7800)
Neutrophils Relative %: 73.6 %
Platelets: 206 10*3/uL (ref 140–400)
RBC: 5.29 10*6/uL — ABNORMAL HIGH (ref 3.80–5.10)
RDW: 12.9 % (ref 11.0–15.0)
TOTAL LYMPHOCYTE: 17.1 %
WBC mixed population: 485 cells/uL (ref 200–950)
WBC: 7.7 10*3/uL (ref 3.8–10.8)

## 2016-12-21 LAB — COMPLETE METABOLIC PANEL WITH GFR
AG RATIO: 2 (calc) (ref 1.0–2.5)
ALBUMIN MSPROF: 4.5 g/dL (ref 3.6–5.1)
ALKALINE PHOSPHATASE (APISO): 59 U/L (ref 33–130)
ALT: 17 U/L (ref 6–29)
AST: 16 U/L (ref 10–35)
BILIRUBIN TOTAL: 0.6 mg/dL (ref 0.2–1.2)
BUN / CREAT RATIO: 31 (calc) — AB (ref 6–22)
BUN: 27 mg/dL — AB (ref 7–25)
CHLORIDE: 103 mmol/L (ref 98–110)
CO2: 26 mmol/L (ref 20–32)
Calcium: 9.8 mg/dL (ref 8.6–10.4)
Creat: 0.87 mg/dL (ref 0.50–0.99)
GFR, Est African American: 83 mL/min/{1.73_m2} (ref >=60)
GFR, Est Non African American: 72 mL/min/{1.73_m2} (ref >=60)
GLUCOSE: 89 mg/dL (ref 65–99)
Globulin: 2.3 g/dL (calc) (ref 1.9–3.7)
POTASSIUM: 4.1 mmol/L (ref 3.5–5.3)
SODIUM: 138 mmol/L (ref 135–146)
Total Protein: 6.8 g/dL (ref 6.1–8.1)

## 2016-12-21 LAB — TSH: TSH: 1.81 mIU/L (ref 0.40–4.50)

## 2016-12-21 MED ORDER — PANTOPRAZOLE SODIUM 20 MG PO TBEC: 20.0000 mg | DELAYED_RELEASE_TABLET | Freq: Every day | ORAL | 0 refills | Status: DC

## 2016-12-21 MED ORDER — ALPRAZOLAM 0.25 MG PO TABS: 0.2500 mg | ORAL_TABLET | Freq: Two times a day (BID) | ORAL | 0 refills | Status: DC | PRN

## 2016-12-21 MED FILL — ALPRAZolam 0.25 MG TABS: 0.25 | 30 days supply | Qty: 60 | Fill #0

## 2016-12-21 MED FILL — PANTOPRAZOLE SOD DR 20 MG T: 20 | 90 days supply | Qty: 90 | Fill #0

## 2016-12-27 NOTE — Patient Instructions (Signed)
Take Xanax as needed for situational stress.  Start Protonix for reflux symptoms.  Call if not getting better in 5-7 days or sooner if worse.  Labs are within normal limits

## 2016-12-27 NOTE — Progress Notes (Signed)
   Subjective:    Patient ID: Jacqueline Henry, female    DOB: [birth year], 61 y.o.   MRN: [id number]  HPI Patient has recently had onset of heartburn symptoms.  No prior history of GE reflux.  Has been worse over the past couple of days.  Situational stress.  No frank waterbrash but has uncomfortable feeling in her throat.  Some difficulty swallowing.  It is as if something is stuck in her throat.    Review of Systems see above     Objective:   Physical Exam Spent 25 minutes speaking with her about this issue.  It sounds like globus sensation with GE reflux.  We did draw CBC with differential, C met, and TSH.  These were within normal limits without evidence of leukocytosis or anemia.  TSH is normal.  Segment was within normal limits.  She was given 30 cc p.o. GI cocktail in the office      Assessment & Plan:  GE reflux  Globus sensation  Situational stress  Plan: Will be placed on Protonix 40 mg daily.  It may take a few days for reflux symptoms to settle down.  Xanax prescription for situational stress issues.  If symptoms do not improve, we will consider barium swallow and H. pylori testing

## 2016-12-29 ENCOUNTER — Encounter: Payer: Self-pay | Admitting: Internal Medicine

## 2016-12-29 MED ORDER — RANITIDINE HCL 150 MG PO TABS: 150.0000 mg | ORAL_TABLET | Freq: Two times a day (BID) | ORAL | 1 refills | Status: DC

## 2016-12-29 MED FILL — raNITIdine HCL 150 MG TABS: 150 | 30 days supply | Qty: 60 | Fill #0

## 2016-12-29 NOTE — Telephone Encounter (Signed)
See note from Pt. ? PPI causing drowsiness. Try Zantac insteadd for heartburn relief

## 2017-02-05 ENCOUNTER — Ambulatory Visit (INDEPENDENT_AMBULATORY_CARE_PROVIDER_SITE_OTHER): Payer: No Typology Code available for payment source | Admitting: Internal Medicine

## 2017-02-05 ENCOUNTER — Encounter: Payer: Self-pay | Admitting: Internal Medicine

## 2017-02-05 VITALS — BP 100/70 | HR 67 | Ht 65.0 in | Wt 123.0 lb

## 2017-02-05 DIAGNOSIS — F458 Other somatoform disorders: Secondary | ICD-10-CM | POA: Diagnosis not present

## 2017-02-05 DIAGNOSIS — K219 Gastro-esophageal reflux disease without esophagitis: Secondary | ICD-10-CM

## 2017-02-05 DIAGNOSIS — R198 Other specified symptoms and signs involving the digestive system and abdomen: Secondary | ICD-10-CM

## 2017-02-05 DIAGNOSIS — R0989 Other specified symptoms and signs involving the circulatory and respiratory systems: Secondary | ICD-10-CM

## 2017-02-05 NOTE — Progress Notes (Signed)
   Subjective:    Patient ID: Jacqueline Henry, female    DOB: 01/19/1956, 62 y.o.   MRN: [id number]  HPI Patient was seen November 19 complaining of heartburn and reflux symptoms.  No frank water brash but had uncomfortable feeling in throat and globus type sensation.  Was treated with Protonix and Xanax.  She thought perhaps Protonix made her drowsy.  Recommended Zantac.  Still having heartburn worse with exercise and globus sensation.  She did try to elevate head of her bed  and watch diet.  Complaining of frequent burping.      Review of Systems see above     Objective:   Physical Exam  Pharynx clear.  Neck is supple.  No thyromegaly or adenopathy.      Assessment & Plan:  GE reflux  Globus sensation  Plan: We are going to increase Protonix to 40 mg twice daily.  She will let me know if not getting better in a couple of weeks.  15 minutes spent with patient.

## 2017-02-16 ENCOUNTER — Encounter: Payer: Self-pay | Admitting: Internal Medicine

## 2017-02-17 ENCOUNTER — Encounter: Payer: Self-pay | Admitting: Internal Medicine

## 2017-02-19 ENCOUNTER — Ambulatory Visit: Payer: No Typology Code available for payment source | Admitting: Internal Medicine

## 2017-02-19 ENCOUNTER — Encounter: Payer: Self-pay | Admitting: Internal Medicine

## 2017-02-19 VITALS — BP 112/78 | HR 64 | Ht 64.0 in | Wt 122.1 lb

## 2017-02-19 DIAGNOSIS — F458 Other somatoform disorders: Secondary | ICD-10-CM

## 2017-02-19 DIAGNOSIS — R12 Heartburn: Secondary | ICD-10-CM | POA: Diagnosis not present

## 2017-02-19 DIAGNOSIS — R0989 Other specified symptoms and signs involving the circulatory and respiratory systems: Secondary | ICD-10-CM

## 2017-02-19 DIAGNOSIS — R198 Other specified symptoms and signs involving the digestive system and abdomen: Secondary | ICD-10-CM

## 2017-02-19 DIAGNOSIS — R079 Chest pain, unspecified: Secondary | ICD-10-CM

## 2017-02-19 DIAGNOSIS — M3500 Sicca syndrome, unspecified: Secondary | ICD-10-CM | POA: Insufficient documentation

## 2017-02-19 MED ORDER — PANTOPRAZOLE SODIUM 40 MG PO TBEC: 40.0000 mg | DELAYED_RELEASE_TABLET | Freq: Two times a day (BID) | ORAL | 0 refills | Status: DC

## 2017-02-19 NOTE — Progress Notes (Signed)
Jacqueline Henry 61 y.o. [birth year] [id number]  Referred by: Margaree MackintoshBaxley, Mary J, MD  Assessment & Plan:   Encounter Diagnoses  Name Primary?  . Heartburn Yes  . Chest pain, non cardiac   . Globus sensation     The symptoms are refractory to acid suppression.  I am going to increase to 40 mg pantoprazole twice daily.  Upper endoscopy will be undertaken to look for any anatomic or mucosal abnormalities that would explain this.  February 7 7:30 in the morning.  She will continue her lifestyle changes as well.  Further plans pending the EGD and clinical course.  There is a question of Sjogren's disease in her history.    If she indeed has that that could have some impact as there is an increased risk of reflux esophagitis due to reduced saliva etc.  It is interesting to note that the symptoms came on right during a stressful period in her life, with work-related stress, though I would have thought that the Xanax treatment would have made a difference she does not think it did.  I appreciate the opportunity to care for this patient. CC: Margaree MackintoshBaxley, Mary J, MD     Subjective:   Chief Complaint: Heartburn and lump in throat  HPI Patient is here today because of complaints of heartburn, referred by Dr. Lenord FellersBaxley.  She never really had any problems until late October, she developed problems with fairly severe burning in the retrosternal area sometimes with pain radiating into the back into the intrascapular area.  She also noted the onset of a chronic lump in her throat or globus.  There were some stressful work-related events around that time, she is a healthcare executive in our organization and there were some things that happen plus she had a new job and it was considered that that may be part of it.  She was placed on pantoprazole 20 mg daily which might have helped a little bit.  Only that though she tried elevating head of bed, she reduced caffeine she avoided reflexogenic foods.  Still no better.   A brief trial of Xanax off and on over a week was used and no difference was noted.  She saw Dr. Lenord FellersBaxley again in January and the pantoprazole was changed to 20 mg twice daily but after about 2-3 weeks there was no benefit so she was referred for evaluation.  She denies any overt dysphagia.  There is been no unintentional weight loss or bleeding or anything like that.  No abdominal pain to speak of except an occasional twinge in the left upper quadrant.  She is a vigorous exerciser and sometimes she will have more belching and some increase in symptoms when she is exercising, but other than that it does not change.  She does have a family history of colon cancer, Dr. Matthias HughsBuccini has done her colonoscopies the last of which was 2 years ago, and was negative.  About 15 years ago she was told after a rheumatology evaluation (Dr. Shawna Orleanseveshawar)for dry eyes and dry/burning mouth and labs that she possibly had Sjogren's disease and Plaquenil was recommended but she decided against that. Still has some dry and burning mouth but eyes ok and never took Tx.  Allergies  Allergen Reactions  . Codeine Nausea And Vomiting    Current Outpatient Medications:  .  albuterol (PROVENTIL HFA;VENTOLIN HFA) 108 (90 Base) MCG/ACT inhaler, Inhale 2 puffs into the lungs every 6 (six) hours as needed for wheezing., Disp: 1 Inhaler, Rfl:  prn .  ALPRAZolam (XANAX) 0.25 MG tablet, Take 1 tablet (0.25 mg total) 2 (two) times daily as needed by mouth for anxiety. (Patient not taking: Reported on 02/19/2017), Disp: 60 tablet, Rfl: 0 .  co-enzyme Q-10 30 MG capsule, Take 30 mg by mouth 3 (three) times daily., Disp: , Rfl:  .  fish oil-omega-3 fatty acids 1000 MG capsule, Take 2 g by mouth daily., Disp: , Rfl:  .  Fluticasone-Salmeterol (ADVAIR DISKUS) 500-50 MCG/DOSE AEPB, Inhale 1 puff into the lungs 2 (two) times daily. (Patient not taking: Reported on 02/19/2017), Disp: 60 each, Rfl: 2 .  Multiple Vitamins-Minerals (MULTIVITAMIN WITH  MINERALS) tablet, Take 1 tablet by mouth daily., Disp: , Rfl:  .  pantoprazole (PROTONIX) 20MG  tablet, Take 1 tablet (2 (two) times daily before a meal., Disp: 180 tablet, Rfl: 0 .  vitamin B-12 (CYANOCOBALAMIN) 100 MCG tablet, Take 100 mcg by mouth daily., Disp: , Rfl:   Past Medical History:  Diagnosis Date  . Asthma   . Sjogren's disease (HCC) ?    ?   Past Surgical History:  Procedure Laterality Date  . COLONOSCOPY     Dr Matthias Hughs  . KNEE ARTHROSCOPY Right ~2000  . 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   Review of Systems Arthritis pain right knee  Objective:   Physical Exam @BP  112/78 (BP Location: Left Arm, Patient Position: Sitting, Cuff Size: Normal)   Pulse 64   Ht 5\' 4"  (1.626 m) Comment: height measured without shoes  Wt 122 lb 2 oz (55.4 kg)   BMI 20.96 kg/m @  General:  Well-developed, well-nourished and in no acute distress Eyes:  anicteric. ENT:   Mouth and posterior pharynx free of lesions.  Neck:   supple w/o thyromegaly or mass.  Lungs: Clear to auscultation bilaterally. Heart:  S1S2, no rubs, murmurs, gallops. Abdomen:  soft, non-tender, no hepatosplenomegaly, hernia, or mass and BS+.  Lymph:  no cervical or supraclavicular adenopathy. Extremities:   no cyanosis or clubbing Neuro:  A&O x 3.  Psych:  appropriate mood and  Affect.   Data Reviewed:  Primary care notes from December 21, 2016, including my chart email messages in between and primary care note of 02/05/2017.  Colonoscopy 05/08/2014 with sigmoid diverticulosis  Labs December 21, 2016 normal TSH and normal chemistries except for a mildly  elevated BUN and a normal CBC

## 2017-02-19 NOTE — Progress Notes (Deleted)
   Jacqueline Henry 61 y.o. 06-27-1955 [id number]  Assessment & Plan:      Subjective:   Chief Complaint:  HPI  Allergies  Allergen Reactions  . Codeine Nausea And Vomiting   Current Meds  Medication Sig  . albuterol (PROVENTIL HFA;VENTOLIN HFA) 108 (90 Base) MCG/ACT inhaler Inhale 2 puffs into the lungs every 6 (six) hours as needed for wheezing.   Past Medical History:  Diagnosis Date  . Asthma   . Sjogren's disease (HCC)    ?   Past Surgical History:  Procedure Laterality Date  . colonoscopy     Dr Matthias HughsBuccini  . KNEE ARTHROSCOPY Right ~2000  . TONSILLECTOMY     Social History   Social History Narrative  . Not on file   family history includes Colon cancer (age of onset: 2952) in her father; Heart attack in her father; Hyperlipidemia in her father; Hypertension in her mother.   Review of Systems   Objective:   Physical Exam

## 2017-02-19 NOTE — Patient Instructions (Signed)
You have been scheduled for an endoscopy. Please follow written instructions given to you at your visit today. If you use inhalers (even only as needed), please bring them with you on the day of your procedure. Your physician has requested that you go to www.startemmi.com and enter the access code given to you at your visit today. This web site gives a general overview about your procedure. However, you should still follow specific instructions given to you by our office regarding your preparation for the procedure.   We have sent the following medications to your pharmacy for you to pick up at your convenience: Pantoprazole  You may double up on the pantoprazole to use up your supply.   I appreciate the opportunity to care for you. Stan Headarl Gessner, MD, [facility]

## 2017-02-21 NOTE — Patient Instructions (Signed)
Increase Protonix to 40 mg twice daily.  Call if not better in 2 weeks or sooner if worse.

## 2017-03-11 ENCOUNTER — Ambulatory Visit (AMBULATORY_SURGERY_CENTER): Payer: No Typology Code available for payment source | Admitting: Internal Medicine

## 2017-03-11 ENCOUNTER — Other Ambulatory Visit: Payer: Self-pay

## 2017-03-11 ENCOUNTER — Encounter: Payer: Self-pay | Admitting: Internal Medicine

## 2017-03-11 VITALS — BP 108/80 | HR 57 | Temp 97.7°F | Resp 13 | Ht 64.0 in | Wt 122.0 lb

## 2017-03-11 DIAGNOSIS — F458 Other somatoform disorders: Secondary | ICD-10-CM

## 2017-03-11 DIAGNOSIS — R12 Heartburn: Secondary | ICD-10-CM | POA: Diagnosis present

## 2017-03-11 DIAGNOSIS — R0989 Other specified symptoms and signs involving the circulatory and respiratory systems: Secondary | ICD-10-CM

## 2017-03-11 DIAGNOSIS — R198 Other specified symptoms and signs involving the digestive system and abdomen: Secondary | ICD-10-CM

## 2017-03-11 MED ORDER — SODIUM CHLORIDE 0.9 % IV SOLN
500.0000 mL | Freq: Once | INTRAVENOUS | Status: DC
Start: 2017-03-11 — End: 2017-09-01

## 2017-03-11 NOTE — Op Note (Signed)
[facility] Patient Name: Jacqueline Henry Procedure Date: 03/11/2017 7:36 AM MRN: [id number] Endoscopist: Iva Booparl E  , MD Age: 62 Referring MD:  Date of Birth: 08/17/1955 Gender: Female Account #: [id number] Procedure:                Upper GI endoscopy Indications:              Heartburn, Globus sensation Medicines:                Propofol per Anesthesia, Monitored Anesthesia Care Procedure:                Pre-Anesthesia Assessment:                           - Prior to the procedure, a History and Physical                            was performed, and patient medications and                            allergies were reviewed. The patient's tolerance of                            previous anesthesia was also reviewed. The risks                            and benefits of the procedure and the sedation                            options and risks were discussed with the patient.                            All questions were answered, and informed consent                            was obtained. Prior Anticoagulants: The patient has                            taken no previous anticoagulant or antiplatelet                            agents. ASA Grade Assessment: II - A patient with                            mild systemic disease. After reviewing the risks                            and benefits, the patient was deemed in                            satisfactory condition to undergo the procedure.                           After obtaining informed consent, the endoscope was  passed under direct vision. Throughout the                            procedure, the patient's blood pressure, pulse, and                            oxygen saturations were monitored continuously. The                            Endoscope was introduced through the mouth, and                            advanced to the second part of duodenum. The upper                            GI  endoscopy was accomplished without difficulty.                            The patient tolerated the procedure well. Scope In: Scope Out: Findings:                 The esophagus was normal.                           The stomach was normal.                           The examined duodenum was normal.                           The scope was withdrawn. Dilation was performed in                            the entire esophagus with a Maloney dilator with no                            resistance at 54 Fr. Complications:            No immediate complications. Estimated Blood Loss:     Estimated blood loss: none. Impression:               - Normal esophagus.                           - Normal stomach.                           - Normal examined duodenum.                           - No specimens collected. Recommendation:           - Patient has a contact number available for                            emergencies. The signs and symptoms of potential  delayed complications were discussed with the                            patient. Return to normal activities tomorrow.                            Written discharge instructions were provided to the                            patient.                           - Clear liquids x 1 hour then soft foods rest of                            day. Start prior diet tomorrow.                           - She is to update me re: sxs in 1 week                           Tried dilation to see if it helps the globus                            sensation                           Seems like this is probably functional heartburn                            (not due to acid reflux)                           - Continue present medications. Iva Boop, MD 03/11/2017 7:58:06 AM This report has been signed electronically.

## 2017-03-11 NOTE — Patient Instructions (Addendum)
The esophagus, stomach and duodenum all looked normal. I dilated the esophagus to see if that helps with your symptoms.  Since blocking acid does not seem to be helping this is probably not an acid-related problem though a bit soon to say that the twice daily medication has failed.  Please message me next week with a symptom update and we will go from there.  I appreciate the opportunity to care for you. Iva Booparl E. Gessner, MD, [facility]   Discharge instructions given. Handout on a dilatation diet. Resume previous medications. YOU HAD AN ENDOSCOPIC PROCEDURE TODAY AT THE [facility]:   Refer to the procedure report that was given to you for any specific questions about what was found during the examination.  If the procedure report does not answer your questions, please call your gastroenterologist to clarify.  If you requested that your care partner not be given the details of your procedure findings, then the procedure report has been included in a sealed envelope for you to review at your convenience later.  YOU SHOULD EXPECT: Some feelings of bloating in the abdomen. Passage of more gas than usual.  Walking can help get rid of the air that was put into your GI tract during the procedure and reduce the bloating. If you had a lower endoscopy (such as a colonoscopy or flexible sigmoidoscopy) you may notice spotting of blood in your stool or on the toilet paper. If you underwent a bowel prep for your procedure, you may not have a normal bowel movement for a few days.  Please Note:  You might notice some irritation and congestion in your nose or some drainage.  This is from the oxygen used during your procedure.  There is no need for concern and it should clear up in a day or so.  SYMPTOMS TO REPORT IMMEDIATELY:   Following upper endoscopy (EGD)  Vomiting of blood or coffee ground material  New chest pain or pain under the shoulder blades  Painful or persistently difficult  swallowing  New shortness of breath  Fever of 100F or higher  Black, tarry-looking stools  For urgent or emergent issues, a gastroenterologist can be reached at any hour by calling [phone].   DIET:  We do recommend a small meal at first, but then you may proceed to your regular diet.  Drink plenty of fluids but you should avoid alcoholic beverages for 24 hours.  ACTIVITY:  You should plan to take it easy for the rest of today and you should NOT DRIVE or use heavy machinery until tomorrow (because of the sedation medicines used during the test).    FOLLOW UP: Our staff will call the number listed on your records the next business day following your procedure to check on you and address any questions or concerns that you may have regarding the information given to you following your procedure. If we do not reach you, we will leave a message.  However, if you are feeling well and you are not experiencing any problems, there is no need to return our call.  We will assume that you have returned to your regular daily activities without incident.  If any biopsies were taken you will be contacted by phone or by letter within the next 1-3 weeks.  Please call us at [phone] if you have not heard about the biopsies in 3 weeks.    SIGNATURES/CONFIDENTIALITY: You and/or your care partner have signed paperwork which will be entered into your  electronic medical record.  These signatures attest to the fact that that the information above on your After Visit Summary has been reviewed and is understood.  Full responsibility of the confidentiality of this discharge information lies with you and/or your care-partner.

## 2017-03-11 NOTE — Progress Notes (Signed)
Called to room to assist during endoscopic procedure.  Patient ID and intended procedure confirmed with present staff. Received instructions for my participation in the procedure from the performing physician.  

## 2017-03-11 NOTE — Progress Notes (Signed)
To PACU, VSS. Report to [email or web address] 

## 2017-03-12 ENCOUNTER — Telehealth: Payer: Self-pay

## 2017-03-12 NOTE — Telephone Encounter (Signed)
  Follow up Call-  Call back number 03/11/2017  Post procedure Call Back phone  # [phone]  Permission to leave phone message Yes  Some recent data might be hidden     Patient questions:  Do you have a fever, pain , or abdominal swelling? No. Pain Score  0 *  Have you tolerated food without any problems? Yes.    Have you been able to return to your normal activities? Yes.    Do you have any questions about your discharge instructions: Diet   No. Medications  No. Follow up visit  No.  Do you have questions or concerns about your Care? No.  Actions: * If pain score is 4 or above: No action needed, pain <4.

## 2017-03-22 ENCOUNTER — Encounter: Payer: Self-pay | Admitting: Internal Medicine

## 2017-03-23 ENCOUNTER — Telehealth: Payer: Self-pay

## 2017-03-23 DIAGNOSIS — R198 Other specified symptoms and signs involving the digestive system and abdomen: Secondary | ICD-10-CM

## 2017-03-23 DIAGNOSIS — R0989 Other specified symptoms and signs involving the circulatory and respiratory systems: Secondary | ICD-10-CM

## 2017-03-23 DIAGNOSIS — R1310 Dysphagia, unspecified: Secondary | ICD-10-CM

## 2017-03-23 NOTE — Telephone Encounter (Signed)
Patient notified  [facility]. She is asked to arrive at 9:15 and be NPO for 3 hours prior.

## 2017-03-23 NOTE — Telephone Encounter (Signed)
-----   Message from Iva Booparl E Gessner, MD sent at 03/23/2017 10:16 AM EST ----- Regarding: needs ba swallow w/ tablet Please contact her and schedule this - dx: dysphagia and globus

## 2017-03-25 ENCOUNTER — Ambulatory Visit (HOSPITAL_COMMUNITY)
Admission: RE | Admit: 2017-03-25 | Discharge: 2017-03-25 | Disposition: A | Discharge status: Home / Self Care | Payer: No Typology Code available for payment source | Source: Ambulatory Visit | Attending: Internal Medicine | Admitting: Internal Medicine

## 2017-03-25 DIAGNOSIS — R1310 Dysphagia, unspecified: Secondary | ICD-10-CM | POA: Insufficient documentation

## 2017-03-25 DIAGNOSIS — R198 Other specified symptoms and signs involving the digestive system and abdomen: Secondary | ICD-10-CM

## 2017-03-25 DIAGNOSIS — F458 Other somatoform disorders: Secondary | ICD-10-CM | POA: Insufficient documentation

## 2017-03-25 DIAGNOSIS — K219 Gastro-esophageal reflux disease without esophagitis: Secondary | ICD-10-CM | POA: Diagnosis not present

## 2017-03-25 DIAGNOSIS — R0989 Other specified symptoms and signs involving the circulatory and respiratory systems: Secondary | ICD-10-CM

## 2017-03-25 NOTE — Progress Notes (Signed)
I reviewed results with her by phone  She did have belching and some back pain w/ ba swallow also.  Overall she is better but still has some belching and back pain w/ that and pc lump in throat She does not have pain with exercising but can get some of the burping with that We do no think cardiac issues likely given her exercise tolerance etc  She will f/u w/ Dr. Lenord FellersBaxley about sxs - in particular the back pain to see if other imaging makes sense to see if there is something in spine/post chest that is a problem  I will be on standby and see her prn

## 2017-03-29 ENCOUNTER — Encounter: Payer: Self-pay | Admitting: Internal Medicine

## 2017-03-29 DIAGNOSIS — R1310 Dysphagia, unspecified: Secondary | ICD-10-CM

## 2017-03-31 ENCOUNTER — Other Ambulatory Visit: Payer: No Typology Code available for payment source | Admitting: Internal Medicine

## 2017-03-31 DIAGNOSIS — R079 Chest pain, unspecified: Secondary | ICD-10-CM

## 2017-03-31 DIAGNOSIS — R1310 Dysphagia, unspecified: Secondary | ICD-10-CM

## 2017-04-01 ENCOUNTER — Encounter: Payer: Self-pay | Admitting: Internal Medicine

## 2017-04-01 LAB — BASIC METABOLIC PANEL
BUN: 22 mg/dL (ref 7–25)
CO2: 29 mmol/L (ref 20–32)
Calcium: 10.4 mg/dL (ref 8.6–10.4)
Chloride: 103 mmol/L (ref 98–110)
Creat: 0.93 mg/dL (ref 0.50–0.99)
GLUCOSE: 105 mg/dL — AB (ref 65–99)
Potassium: 5.6 mmol/L — ABNORMAL HIGH (ref 3.5–5.3)
SODIUM: 137 mmol/L (ref 135–146)

## 2017-04-02 ENCOUNTER — Ambulatory Visit (HOSPITAL_BASED_OUTPATIENT_CLINIC_OR_DEPARTMENT_OTHER)
Admission: RE | Admit: 2017-04-02 | Discharge: 2017-04-02 | Disposition: A | Discharge status: Home / Self Care | Payer: No Typology Code available for payment source | Source: Ambulatory Visit | Attending: Internal Medicine | Admitting: Internal Medicine

## 2017-04-02 ENCOUNTER — Encounter: Payer: Self-pay | Admitting: Internal Medicine

## 2017-04-02 ENCOUNTER — Encounter (HOSPITAL_BASED_OUTPATIENT_CLINIC_OR_DEPARTMENT_OTHER): Payer: Self-pay

## 2017-04-02 DIAGNOSIS — I77810 Thoracic aortic ectasia: Secondary | ICD-10-CM | POA: Insufficient documentation

## 2017-04-02 DIAGNOSIS — R310 Gross hematuria: Secondary | ICD-10-CM | POA: Diagnosis present

## 2017-04-02 MED ORDER — IOPAMIDOL (ISOVUE-300) INJECTION 61%
100.0000 mL | Freq: Once | INTRAVENOUS | Status: AC | PRN
  Administered 2017-04-02: 100 mL via INTRAVENOUS

## 2017-04-02 MED ORDER — AMITRIPTYLINE HCL 10 MG PO TABS: 10.0000 mg | ORAL_TABLET | Freq: Every day | ORAL | 0 refills | Status: DC

## 2017-04-02 MED FILL — AMITRIPTYLINE HCL 10 MG TAB: 10 | 30 days supply | Qty: 30 | Fill #0

## 2017-04-02 NOTE — Telephone Encounter (Signed)
Trying low dose Elavil for globus sensation as suggested by Dr. Leone PayorGessner

## 2017-04-07 ENCOUNTER — Ambulatory Visit (HOSPITAL_COMMUNITY): Payer: No Typology Code available for payment source

## 2017-04-22 ENCOUNTER — Other Ambulatory Visit (HOSPITAL_COMMUNITY): Payer: No Typology Code available for payment source

## 2017-06-21 ENCOUNTER — Other Ambulatory Visit: Payer: Self-pay | Admitting: Internal Medicine

## 2017-06-21 ENCOUNTER — Encounter: Payer: Self-pay | Admitting: Internal Medicine

## 2017-06-21 DIAGNOSIS — F439 Reaction to severe stress, unspecified: Secondary | ICD-10-CM

## 2017-06-21 MED ORDER — ALPRAZOLAM 0.25 MG PO TABS: 0.2500 mg | ORAL_TABLET | Freq: Two times a day (BID) | ORAL | 5 refills | Status: DC | PRN

## 2017-06-21 MED ORDER — ALBUTEROL SULFATE HFA 108 (90 BASE) MCG/ACT IN AERS: 2.0000 | INHALATION_SPRAY | Freq: Four times a day (QID) | RESPIRATORY_TRACT | 99 refills | Status: DC | PRN

## 2017-06-21 MED FILL — VENTOLIN HFA 90 MCG INHALER: 108 (90 BAS | 25 days supply | Qty: 18 | Fill #0

## 2017-06-21 MED FILL — ALPRAZolam 0.25 MG TABS: 0.25 | 30 days supply | Qty: 60 | Fill #0

## 2017-06-21 NOTE — Telephone Encounter (Signed)
Refill x 6 months 

## 2017-08-11 ENCOUNTER — Other Ambulatory Visit: Payer: Self-pay | Admitting: Internal Medicine

## 2017-08-11 DIAGNOSIS — Z8739 Personal history of other diseases of the musculoskeletal system and connective tissue: Secondary | ICD-10-CM

## 2017-08-11 DIAGNOSIS — Z1329 Encounter for screening for other suspected endocrine disorder: Secondary | ICD-10-CM

## 2017-08-11 DIAGNOSIS — E7800 Pure hypercholesterolemia, unspecified: Secondary | ICD-10-CM

## 2017-08-11 DIAGNOSIS — Z1321 Encounter for screening for nutritional disorder: Secondary | ICD-10-CM

## 2017-08-11 DIAGNOSIS — Z0000 Encounter for general adult medical examination without abnormal findings: Secondary | ICD-10-CM

## 2017-08-16 ENCOUNTER — Other Ambulatory Visit: Payer: No Typology Code available for payment source | Admitting: Internal Medicine

## 2017-08-16 DIAGNOSIS — Z0000 Encounter for general adult medical examination without abnormal findings: Secondary | ICD-10-CM

## 2017-08-16 DIAGNOSIS — E7800 Pure hypercholesterolemia, unspecified: Secondary | ICD-10-CM

## 2017-08-16 DIAGNOSIS — Z1329 Encounter for screening for other suspected endocrine disorder: Secondary | ICD-10-CM

## 2017-08-16 DIAGNOSIS — Z8739 Personal history of other diseases of the musculoskeletal system and connective tissue: Secondary | ICD-10-CM

## 2017-08-16 DIAGNOSIS — Z1321 Encounter for screening for nutritional disorder: Secondary | ICD-10-CM

## 2017-08-17 ENCOUNTER — Other Ambulatory Visit: Payer: Self-pay | Admitting: Internal Medicine

## 2017-08-17 LAB — TEST AUTHORIZATION

## 2017-08-17 LAB — CBC WITH DIFFERENTIAL/PLATELET
BASOS PCT: 1.1 %
Basophils Absolute: 61 cells/uL (ref 0–200)
EOS ABS: 198 {cells}/uL (ref 15–500)
Eosinophils Relative: 3.6 %
HCT: 45.2 % — ABNORMAL HIGH (ref 35.0–45.0)
HEMOGLOBIN: 15.2 g/dL (ref 11.7–15.5)
Lymphs Abs: 1485 cells/uL (ref 850–3900)
MCH: 28.7 pg (ref 27.0–33.0)
MCHC: 33.6 g/dL (ref 32.0–36.0)
MCV: 85.3 fL (ref 80.0–100.0)
MPV: 11.8 fL (ref 7.5–12.5)
Monocytes Relative: 7.7 %
NEUTROS ABS: 3333 {cells}/uL (ref 1500–7800)
Neutrophils Relative %: 60.6 %
Platelets: 192 10*3/uL (ref 140–400)
RBC: 5.3 10*6/uL — ABNORMAL HIGH (ref 3.80–5.10)
RDW: 13.1 % (ref 11.0–15.0)
Total Lymphocyte: 27 %
WBC: 5.5 10*3/uL (ref 3.8–10.8)
WBCMIX: 424 {cells}/uL (ref 200–950)

## 2017-08-17 LAB — COMPLETE METABOLIC PANEL WITH GFR
AG Ratio: 1.9 (calc) (ref 1.0–2.5)
ALT: 14 U/L (ref 6–29)
AST: 22 U/L (ref 10–35)
Albumin: 4.8 g/dL (ref 3.6–5.1)
Alkaline phosphatase (APISO): 60 U/L (ref 33–130)
BUN: 23 mg/dL (ref 7–25)
CALCIUM: 9.8 mg/dL (ref 8.6–10.4)
CO2: 27 mmol/L (ref 20–32)
CREATININE: 0.95 mg/dL (ref 0.50–0.99)
Chloride: 101 mmol/L (ref 98–110)
GFR, EST AFRICAN AMERICAN: 74 mL/min/{1.73_m2} (ref >=60)
GFR, EST NON AFRICAN AMERICAN: 64 mL/min/{1.73_m2} (ref >=60)
GLOBULIN: 2.5 g/dL (ref 1.9–3.7)
Glucose, Bld: 79 mg/dL (ref 65–99)
Potassium: 4.8 mmol/L (ref 3.5–5.3)
Sodium: 137 mmol/L (ref 135–146)
TOTAL PROTEIN: 7.3 g/dL (ref 6.1–8.1)
Total Bilirubin: 1.4 mg/dL — ABNORMAL HIGH (ref 0.2–1.2)

## 2017-08-17 LAB — BILIRUBIN, FRACTIONATED(TOT/DIR/INDIR)
BILIRUBIN INDIRECT: 1.2 mg/dL (ref 0.2–1.2)
Bilirubin, Direct: 0.2 mg/dL (ref 0.0–0.2)
Total Bilirubin: 1.4 mg/dL — ABNORMAL HIGH (ref 0.2–1.2)

## 2017-08-17 LAB — LIPID PANEL
CHOL/HDL RATIO: 3 (calc) (ref <5.0)
CHOLESTEROL: 223 mg/dL — AB (ref <200)
HDL: 75 mg/dL (ref >50)
LDL Cholesterol (Calc): 133 mg/dL (calc) — ABNORMAL HIGH
Non-HDL Cholesterol (Calc): 148 mg/dL (calc) — ABNORMAL HIGH (ref <130)
Triglycerides: 62 mg/dL (ref <150)

## 2017-08-17 LAB — TSH: TSH: 1.99 m[IU]/L (ref 0.40–4.50)

## 2017-08-17 LAB — VITAMIN D 25 HYDROXY (VIT D DEFICIENCY, FRACTURES): VIT D 25 HYDROXY: 42 ng/mL (ref 30–100)

## 2017-08-19 ENCOUNTER — Ambulatory Visit (INDEPENDENT_AMBULATORY_CARE_PROVIDER_SITE_OTHER): Payer: No Typology Code available for payment source | Admitting: Internal Medicine

## 2017-08-19 ENCOUNTER — Encounter (INDEPENDENT_AMBULATORY_CARE_PROVIDER_SITE_OTHER): Payer: Self-pay

## 2017-08-19 ENCOUNTER — Encounter: Payer: Self-pay | Admitting: Internal Medicine

## 2017-08-19 VITALS — BP 104/70 | Ht 64.0 in | Wt 120.0 lb

## 2017-08-19 DIAGNOSIS — K219 Gastro-esophageal reflux disease without esophagitis: Secondary | ICD-10-CM

## 2017-08-19 DIAGNOSIS — E7800 Pure hypercholesterolemia, unspecified: Secondary | ICD-10-CM | POA: Diagnosis not present

## 2017-08-19 DIAGNOSIS — R17 Unspecified jaundice: Secondary | ICD-10-CM | POA: Diagnosis not present

## 2017-08-19 DIAGNOSIS — M810 Age-related osteoporosis without current pathological fracture: Secondary | ICD-10-CM

## 2017-08-19 DIAGNOSIS — Z0000 Encounter for general adult medical examination without abnormal findings: Secondary | ICD-10-CM | POA: Diagnosis not present

## 2017-08-19 LAB — POCT URINALYSIS DIPSTICK
Appearance: NORMAL
BILIRUBIN UA: NEGATIVE
Blood, UA: NEGATIVE
GLUCOSE UA: NEGATIVE
KETONES UA: NEGATIVE
Leukocytes, UA: NEGATIVE
NITRITE UA: NEGATIVE
ODOR: NORMAL
PROTEIN UA: NEGATIVE
Spec Grav, UA: 1.01 (ref 1.010–1.025)
Urobilinogen, UA: 0.2 E.U./dL
pH, UA: 6.5 (ref 5.0–8.0)

## 2017-08-19 NOTE — Progress Notes (Signed)
Subjective:    Patient ID: Jacqueline Henry, female    DOB: 04-22-1955, 62 y.o.   MRN: [id number]  HPI 62 year old Female for health maintenance exam and evaluation of medical issues.  History of osteoporosis.  Did not tolerate Fosamax.  It caused GI symptoms.  Boniva did not agree with her either.  Had metatarsal stress fracture  in January 2018 and involved left second metatarsal.  She generally runs 15 to 20 miles per week as well as bikes and lifts weights.  Injured her right knee in March at the gym.  Had pain and swelling.  Saw Dr. Cleophas DunkerWhitfield.  Knee sometimes feels like it is catching.  Received methylprednisolone injection today.  Dr. Leone PayorGessner did upper endoscopy in February of this year for heartburn and globus sensation.  Entire esophagus was dilated with [facility] dilator.  Symptoms subsequently improved.  Right knee arthroscopic knee proximally 1998 by Dr. Cleophas DunkerWhitfield.  She had 5 eye surgeries between 1959 and 1962.  Had colonoscopy by Dr. Matthias HughsBuccini 2016.  History of Vitamin B12 deficiency.  Has been diagnosed with Sjogren's syndrome by Dr. Corliss Skainseveshwar.  No known drug allergies.  Intolerant of codeine.  Social history: She is married.  Husband is retired and has had prostate cancer.  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  Non-smoker.  Social alcohol consumption.  She trained as a physical therapist.  Does a lot of Executive coaching.  Family history: Father deceased with history of colon cancer and heart disease.  Mother  with hypertension and hyperlipidemia.  One brother in good health.  One sister with history of obesity heart disease of breast cancer.  History of mild exercise-induced asthma.  History of pure hypercholesterolemia.  Total cholesterol is 223, HDL 75, triglycerides 62 and LDL cholesterol 133.  I suggested she try Crestor 5 mg 3 times a week and follow-up in September with lipid panel liver functions and no office visit.  She has  mild elevation of bilirubin at 1.4 and normal is  1.2 but when it was fractionated it was clear she has an indirect fraction which is at upper limits of normal at 1.2 consistent with Gilbert's syndrome.  This is frequently seen in athletes and fasting state.      Review of Systems  Constitutional: Negative.   All other systems reviewed and are negative.      Objective:   Physical Exam  Constitutional: She is oriented to person, place, and time. She appears well-developed and well-nourished. No distress.  HENT:  Head: Normocephalic and atraumatic.  Right Ear: External ear normal.  Left Ear: External ear normal.  Nose: Nose normal.  Mouth/Throat: Oropharynx is clear and moist.  Eyes: Pupils are equal, round, and reactive to light. Conjunctivae and EOM are normal. Right eye exhibits no discharge. Left eye exhibits no discharge.  Neck: No JVD present. No thyromegaly present.  Cardiovascular: Normal rate, regular rhythm and normal heart sounds. Exam reveals no friction rub.  No murmur heard. Pulmonary/Chest: Effort normal. No stridor. No respiratory distress. She has no wheezes. She has no rales.  Abdominal: Soft. Bowel sounds are normal. She exhibits no distension and no mass. There is no tenderness. There is no rebound and no guarding. No hernia.  Genitourinary:  Genitourinary Comments: Pap done 2018  Musculoskeletal: She exhibits no edema.  Lymphadenopathy:    She has no cervical adenopathy.  Neurological: She is alert and oriented to person, place, and time. She displays normal  reflexes. No cranial nerve deficit or sensory deficit. She exhibits normal muscle tone. Coordination normal.  Skin: Skin is warm and dry. She is not diaphoretic.  Psychiatric: She has a normal mood and affect. Her behavior is normal. Judgment and thought content normal.  Vitals reviewed.         Assessment & Plan:  Normal health maintenance exam  GE reflux treated with PPI  Recent right knee  injury followed by Dr. Cleophas Dunker  Osteoporosis intolerant of oral bisphosphonates  Pure hypercholesterolemia to try Crestor 5 mg 3 times a week and follow-up in September

## 2017-08-20 LAB — BILIRUBIN, FRACTIONATED(TOT/DIR/INDIR)
BILIRUBIN TOTAL: 1 mg/dL (ref 0.2–1.2)
Bilirubin, Direct: 0.2 mg/dL (ref 0.0–0.2)
Indirect Bilirubin: 0.8 mg/dL (calc) (ref 0.2–1.2)

## 2017-08-25 ENCOUNTER — Telehealth: Payer: Self-pay | Admitting: Internal Medicine

## 2017-08-25 MED ORDER — ROSUVASTATIN CALCIUM 5 MG PO TABS: ORAL_TABLET | ORAL | 3 refills | Status: DC

## 2017-08-25 MED FILL — ROSUVASTATIN CALCIUM 5 MG T: 5 | 46 days supply | Qty: 20 | Fill #0

## 2017-08-25 NOTE — Telephone Encounter (Signed)
Verbal order by Dr. Lenord FellersBaxley.  Patient to start Crestor 5mg .  Take 1 tablet three times per week.  #20, 3 refills  Pharmacy:  [facility]  Thank you.

## 2017-08-25 NOTE — Telephone Encounter (Signed)
Escribed

## 2017-09-01 NOTE — Patient Instructions (Addendum)
Fractionated bilirubin confirms Gilbert's syndrome.  Continue current medications.  Try Crestor 5 mg 3 times a week and follow-up in September with lipid panel and liver functions without office visit.  It was a pleasure to see you today.

## 2017-09-13 MED ORDER — EZETIMIBE 10 MG PO TABS
10.0000 mg | ORAL_TABLET | Freq: Every day | ORAL | 1 refills | Status: DC
Start: 2017-09-13 — End: 2017-12-13

## 2017-09-13 MED FILL — EZETIMIBE 10 MG TABLET: 10 | 30 days supply | Qty: 30 | Fill #0

## 2017-09-22 MED FILL — PANTOPRAZOLE SOD DR 20 MG T: 20 | 90 days supply | Qty: 180 | Fill #0

## 2017-10-18 MED FILL — EZETIMIBE 10 MG TABLET: 10 | 30 days supply | Qty: 30 | Fill #1

## 2017-10-19 ENCOUNTER — Other Ambulatory Visit: Payer: No Typology Code available for payment source | Admitting: Internal Medicine

## 2017-11-01 MED FILL — VENTOLIN HFA 90 MCG INHALER: 108 (90 BAS | 25 days supply | Qty: 18 | Fill #1

## 2017-11-02 ENCOUNTER — Other Ambulatory Visit: Payer: Self-pay | Admitting: Internal Medicine

## 2017-11-02 DIAGNOSIS — Z1231 Encounter for screening mammogram for malignant neoplasm of breast: Secondary | ICD-10-CM

## 2017-11-08 ENCOUNTER — Other Ambulatory Visit: Payer: Self-pay | Admitting: Internal Medicine

## 2017-11-08 DIAGNOSIS — E7800 Pure hypercholesterolemia, unspecified: Secondary | ICD-10-CM

## 2017-11-09 ENCOUNTER — Other Ambulatory Visit: Payer: No Typology Code available for payment source | Admitting: Internal Medicine

## 2017-11-09 DIAGNOSIS — E7800 Pure hypercholesterolemia, unspecified: Secondary | ICD-10-CM

## 2017-11-09 LAB — LIPID PANEL
Cholesterol: 203 mg/dL — ABNORMAL HIGH (ref <200)
HDL: 72 mg/dL (ref >50)
LDL CHOLESTEROL (CALC): 114 mg/dL — AB
Non-HDL Cholesterol (Calc): 131 mg/dL (calc) — ABNORMAL HIGH (ref <130)
TRIGLYCERIDES: 76 mg/dL (ref <150)
Total CHOL/HDL Ratio: 2.8 (calc) (ref <5.0)

## 2017-11-09 LAB — HEPATIC FUNCTION PANEL
AG RATIO: 1.8 (calc) (ref 1.0–2.5)
ALKALINE PHOSPHATASE (APISO): 62 U/L (ref 33–130)
ALT: 16 U/L (ref 6–29)
AST: 21 U/L (ref 10–35)
Albumin: 4.6 g/dL (ref 3.6–5.1)
BILIRUBIN INDIRECT: 0.8 mg/dL (ref 0.2–1.2)
Bilirubin, Direct: 0.2 mg/dL (ref 0.0–0.2)
Globulin: 2.6 g/dL (calc) (ref 1.9–3.7)
TOTAL PROTEIN: 7.2 g/dL (ref 6.1–8.1)
Total Bilirubin: 1 mg/dL (ref 0.2–1.2)

## 2017-12-10 ENCOUNTER — Telehealth: Payer: Self-pay | Admitting: Internal Medicine

## 2017-12-10 NOTE — Telephone Encounter (Signed)
Patient wanted a verbal order to see Dr. Roanna Epley for right shoulder pain.  Dr. Lenord Fellers provided verbal order.  Patient called [facility] and received her [facility] referral [id number].  Patient couldn't get in to see Dr. Darrick Penna until after the new year.  So, she is going to see Dr. Margaretha Sheffield instead.  Appt. 12/16/17.

## 2017-12-13 ENCOUNTER — Other Ambulatory Visit: Payer: Self-pay | Admitting: Internal Medicine

## 2017-12-13 ENCOUNTER — Ambulatory Visit: Payer: No Typology Code available for payment source

## 2017-12-13 MED FILL — EZETIMIBE 10 MG TABLET: 10 | 90 days supply | Qty: 90 | Fill #0

## 2017-12-13 MED FILL — ALPRAZolam 0.25 MG TABS: 0.25 | 30 days supply | Qty: 60 | Fill #1

## 2017-12-16 ENCOUNTER — Telehealth: Payer: Self-pay | Admitting: Emergency Medicine

## 2017-12-16 ENCOUNTER — Ambulatory Visit: Payer: No Typology Code available for payment source | Admitting: Sports Medicine

## 2017-12-16 ENCOUNTER — Encounter: Payer: Self-pay | Admitting: Sports Medicine

## 2017-12-16 VITALS — BP 114/84 | Ht 65.0 in | Wt 118.0 lb

## 2017-12-16 DIAGNOSIS — M25511 Pain in right shoulder: Secondary | ICD-10-CM | POA: Diagnosis not present

## 2017-12-16 NOTE — Progress Notes (Signed)
Jacqueline Henry - 62 y.o. female MRN [id number]  Date of birth: 1955-09-08    SUBJECTIVE:      Chief Complaint:/ HPI:  62 year old female with right shoulder pain for 3 months.  Patient began while weightlifting.  She initially noticed some mild pain and decrease the weight.  Her pain gradually increased over the following days and weeks.  She has been resting for the past 8 weeks.  For the past 6 her pain has significantly been worsening.  Her pain is over the anterior aspect of the shoulder.  She also notices some pain in the scapula as well as radiation of pain into the biceps.  Her pain is increased with flexion and abduction.  Is painful for her to carry objects in her hand.  She feels as though she has decreased external rotation.  She has been previously doing physical therapy at home but has not noticed much benefit.  She denies any localized swelling or erythema.  No associated skin changes.  No numbness or tingling   ROS:     See HPI  PERTINENT  PMH / PSH FH / / SH:  Past Medical, Surgical, Social, and Family History Reviewed & Updated in the EMR.    OBJECTIVE: BP 114/84   Ht 5\' 5"  (1.651 m)   Wt 118 lb (53.5 kg)   BMI 19.64 kg/m   Physical Exam:  Vital signs are reviewed.  GEN: Alert and oriented, NAD Pulm: Breathing unlabored PSY: normal mood, congruent affect  MSK: Right shoulder: No obvious deformity or asymmetry. No bruising. No swelling Significant tenderness over the proximal biceps tendon Patient has full but painful active ROM in flexion, abduction, and internal rotation.  She is slightly limited in active external rotation however she does have full passive range of motion in external rotation. NV intact distally Special Tests:  - Impingement: Negative Hawkins.  Positive Neers.  - Supraspinatous: Equivocal empty can.  4+/5 strength - Infraspinatous/Teres: 5/5 strength with ER - Subscapularis: Pain with belly press, negative bear hug.  4 +/5 strength with IR -  Biceps tendon: Positive speeds.  Positive Yergason's - Labrum: Negative Obriens.  - AC Joint: Negative cross arm  MSK US: Ultrasound of Shoulder-right shoulder  BT short: There is significant fluid surrounding the biceps tendon proximally.  There is also evidence of a longitudinal interstitial tear BT long: Again, fluid surrounding the biceps tendon and hypoechoic change in area of the tear. Supraspinatus tendon: Intact without tears.  No fluid within the subacromial bursa Subscapularis tendon: Intact without tears Infraspinatus tendon: Tacked without tear Teres Minor tendon: Intact without tears AC joint: No significant arthritis or effusion  Summary and Additional findings- longitudinal biceps tear and tenosynovitis  Left shoulder: No obvious deformity or swelling No tenderness over the biceps tendon Full range of motion with 5/5 strength N/V intact     ASSESSMENT & PLAN:  1.  Right shoulder pain secondary to interstitial biceps tendon tear and tenosynovitis -We will obtain MRI to further evaluate. - She will follow-up after the MRI obtained we will discuss further management regarding referral for tenotomy versus tenodesis or more conservative treatment with rehab   Patient seen and evaluated with the sports medicine fellow.  I agree with the above plan of care.  Ultrasound evaluation of the right shoulder shows fluid around the biceps tendon with partial tearing.  Patient's symptoms are significant enough that she would like to entertain the idea of a proximal biceps tenodesis or tenotomy.  Therefore,  we will order an MRI scan of the right shoulder to confirm ultrasound diagnosis and for presurgical planning.  Phone follow-up with those results when available.  Patient prefers to see Dr. Rennis Henry if surgery is needed.

## 2017-12-16 NOTE — Telephone Encounter (Signed)
Pt called and wanted to make you aware that the sports medicine doctor that your sent her to is going to send her for an MRI. She has the focus plan. Thanks.

## 2017-12-19 ENCOUNTER — Ambulatory Visit
Admission: RE | Admit: 2017-12-19 | Discharge: 2017-12-19 | Disposition: A | Discharge status: Home / Self Care | Payer: No Typology Code available for payment source | Source: Ambulatory Visit | Attending: Sports Medicine | Admitting: Sports Medicine

## 2017-12-19 DIAGNOSIS — M25511 Pain in right shoulder: Secondary | ICD-10-CM

## 2017-12-22 ENCOUNTER — Telehealth: Payer: Self-pay | Admitting: Sports Medicine

## 2017-12-22 MED FILL — METHYLPREDNISOLONE 4 MG TAB: 4 | 6 days supply | Qty: 21 | Fill #0

## 2017-12-22 MED FILL — MELOXICAM 15 MG TABLET: 15 | 30 days supply | Qty: 30 | Fill #0

## 2017-12-22 NOTE — Telephone Encounter (Signed)
  MRI of the right shoulder reviewed.  She has findings consistent with adhesive capsulitis as well as tendinopathy of the proximal biceps tendon.  Patient is having worsening shoulder pain.  I recommended consultation with Dr. Dion SaucierLandau to discuss treatment options.  Patient agrees with this plan.

## 2017-12-28 ENCOUNTER — Ambulatory Visit: Payer: No Typology Code available for payment source | Admitting: Family Medicine

## 2017-12-28 ENCOUNTER — Encounter

## 2018-01-10 ENCOUNTER — Ambulatory Visit: Payer: No Typology Code available for payment source

## 2018-01-14 ENCOUNTER — Encounter: Payer: Self-pay | Admitting: Plastic Surgery

## 2018-02-18 ENCOUNTER — Ambulatory Visit
Admission: RE | Admit: 2018-02-18 | Discharge: 2018-02-18 | Disposition: A | Discharge status: Home / Self Care | Payer: 59 | Source: Ambulatory Visit | Attending: Internal Medicine | Admitting: Internal Medicine

## 2018-02-18 DIAGNOSIS — Z1231 Encounter for screening mammogram for malignant neoplasm of breast: Secondary | ICD-10-CM

## 2018-03-01 ENCOUNTER — Ambulatory Visit: Payer: Self-pay

## 2018-03-01 ENCOUNTER — Ambulatory Visit (INDEPENDENT_AMBULATORY_CARE_PROVIDER_SITE_OTHER): Payer: 59 | Admitting: Sports Medicine

## 2018-03-01 ENCOUNTER — Encounter: Payer: Self-pay | Admitting: Sports Medicine

## 2018-03-01 VITALS — BP 100/64 | Ht 65.0 in | Wt 120.0 lb

## 2018-03-01 DIAGNOSIS — M25511 Pain in right shoulder: Secondary | ICD-10-CM | POA: Diagnosis not present

## 2018-03-01 DIAGNOSIS — S46219A Strain of muscle, fascia and tendon of other parts of biceps, unspecified arm, initial encounter: Secondary | ICD-10-CM | POA: Diagnosis not present

## 2018-03-01 MED ORDER — NITROGLYCERIN 0.2 MG/HR TD PT24: MEDICATED_PATCH | TRANSDERMAL | 1 refills | Status: DC

## 2018-03-01 MED FILL — NITROGLYCERIN 0.2 MG/HR PTC: 0.2 | 28 days supply | Qty: 7 | Fill #0

## 2018-03-01 NOTE — Patient Instructions (Signed)
Nitroglycerin Protocol   Apply 1/4 nitroglycerin patch to affected area daily.  Change position of patch within the affected area every 24 hours.  You may experience a headache during the first 1-2 weeks of using the patch, these should subside.  If you experience headaches after beginning nitroglycerin patch treatment, you may take your preferred over the counter pain reliever.  Another side effect of the nitroglycerin patch is skin irritation or rash related to patch adhesive.  Please notify our office if you develop more severe headaches or rash, and stop the patch.  Tendon healing with nitroglycerin patch may require 12 to 24 weeks depending on the extent of injury.  Men should not use if taking Viagra, Cialis, or Levitra.   Do not use if you have migraines or rosacea.   Follow up with us in 6 weeks. 

## 2018-03-01 NOTE — Assessment & Plan Note (Signed)
We will try NTG protocol Light weight with curls and forearm rolls  Reassured patient that cause of persistent pain is the worsening of the status of Biceps/ essentially complete tear  Reck 6 weeks

## 2018-03-01 NOTE — Progress Notes (Signed)
CC; RT arm pain  Onset of RT arm pain in September Saw Dr. Margaretha Sheffield in [facility].  With US showing partial BT tear Limiting her ability to remain active and underwent MRI This showed tendinopathy and bursitis Dr. Dion Saucier gave her CSI and meloxicam Symptoms improved about 80%  RTC now because more upper arm pain anytime she tries to lift With putting on coat or pulling up pants  ROS Does not waken her at night Feels that her ROM is good No neck pain or radiculopathy  PE Pleasant fit F in NAD BP 100/64   Ht 5\' 5"  (1.651 m)   Wt 120 lb (54.4 kg)   BMI 19.97 kg/m   Shoulder: Inspection reveals no abnormalities, atrophy or asymmetry. Palpation is normal with no tenderness over AC joint or bicipital groove. ROM is full in all planes. Rotator cuff strength normal throughout. No signs of impingement with negative Neer and Hawkin's tests, empty can. Speeds and Yergason's tests are both painful.   No labral pathology noted with negative Obrien's, negative clunk and good stability. Normal scapular function observed. No painful arc and no drop arm sign. No apprehension sign  Neck with full ROM No Pain with spurlings  Ultrasound of Right Shoulder  Biceps short shows hypoechoic change with some hyperechoic fibers remaining Only minimal amount of tendon present Biceps long view shows a near complete tear of proximal biceps tendon with marked hypoercoic change Some biceps fibers retracted Subscapularis intact Supraspinatus intact Infrapinatus/ TM intact AC Joint normal  Impression: near complete proximal biceps tendon tear  Ultrasound and interpretation by Sibyl Parr. Darrick Penna, MD

## 2018-03-10 ENCOUNTER — Other Ambulatory Visit: Payer: Self-pay | Admitting: Internal Medicine

## 2018-03-10 DIAGNOSIS — I77810 Thoracic aortic ectasia: Secondary | ICD-10-CM

## 2018-03-10 NOTE — Addendum Note (Signed)
Addended by: Gregery Na on: 03/10/2018 02:37 PM   Modules accepted: Orders

## 2018-03-23 ENCOUNTER — Other Ambulatory Visit: Payer: No Typology Code available for payment source | Admitting: Internal Medicine

## 2018-03-23 DIAGNOSIS — I77810 Thoracic aortic ectasia: Secondary | ICD-10-CM

## 2018-03-23 LAB — BASIC METABOLIC PANEL
BUN: 20 mg/dL (ref 7–25)
CHLORIDE: 105 mmol/L (ref 98–110)
CO2: 28 mmol/L (ref 20–32)
CREATININE: 0.94 mg/dL (ref 0.50–0.99)
Calcium: 10.2 mg/dL (ref 8.6–10.4)
Glucose, Bld: 97 mg/dL (ref 65–99)
POTASSIUM: 5.3 mmol/L (ref 3.5–5.3)
SODIUM: 141 mmol/L (ref 135–146)

## 2018-03-24 ENCOUNTER — Other Ambulatory Visit: Payer: No Typology Code available for payment source | Admitting: Internal Medicine

## 2018-03-25 ENCOUNTER — Ambulatory Visit (HOSPITAL_COMMUNITY)
Admission: RE | Admit: 2018-03-25 | Discharge: 2018-03-25 | Disposition: A | Discharge status: Home / Self Care | Payer: 59 | Source: Ambulatory Visit | Attending: Internal Medicine | Admitting: Internal Medicine

## 2018-03-25 ENCOUNTER — Encounter (HOSPITAL_COMMUNITY): Payer: Self-pay

## 2018-03-25 DIAGNOSIS — I77810 Thoracic aortic ectasia: Secondary | ICD-10-CM | POA: Diagnosis not present

## 2018-03-25 DIAGNOSIS — I712 Thoracic aortic aneurysm, without rupture: Secondary | ICD-10-CM | POA: Diagnosis not present

## 2018-03-25 MED ORDER — IOPAMIDOL (ISOVUE-370) INJECTION 76%
INTRAVENOUS | Status: AC
  Filled 2018-03-25: qty 100

## 2018-03-25 MED ORDER — IOPAMIDOL (ISOVUE-370) INJECTION 76%
75.0000 mL | Freq: Once | INTRAVENOUS | Status: AC | PRN
  Administered 2018-03-25: 75 mL via INTRAVENOUS

## 2018-03-25 MED ORDER — SODIUM CHLORIDE (PF) 0.9 % IJ SOLN
INTRAMUSCULAR | Status: AC
  Filled 2018-03-25: qty 50

## 2018-03-28 ENCOUNTER — Other Ambulatory Visit: Payer: Self-pay

## 2018-03-28 ENCOUNTER — Institutional Professional Consult (permissible substitution): Payer: 59 | Admitting: Thoracic Surgery (Cardiothoracic Vascular Surgery)

## 2018-03-28 ENCOUNTER — Encounter: Payer: Self-pay | Admitting: Thoracic Surgery (Cardiothoracic Vascular Surgery)

## 2018-03-28 ENCOUNTER — Other Ambulatory Visit: Payer: Self-pay | Admitting: *Deleted

## 2018-03-28 DIAGNOSIS — I7789 Other specified disorders of arteries and arterioles: Secondary | ICD-10-CM | POA: Diagnosis not present

## 2018-03-28 NOTE — Progress Notes (Addendum)
[address]       [name] [address]             [phone]     CARDIOTHORACIC SURGERY CONSULTATION REPORT  Referring Provider is Baxley, Luanna Cole, MD PCP is Baxley, Luanna Cole, MD  Chief Complaint  Patient presents with  . TAA    CTA CHEST 03/25/18    HPI:  Patient is a 63 year old female who is otherwise healthy and has been referred for evaluation of dilatation of the ascending thoracic aorta.  Patient states that approximately 1 year ago she underwent CT scan of the chest because of atypical symptoms of chest pain that have been attributed to the presence of GE reflux disease.  CT scan revealed mild dilatation of the ascending thoracic aorta with maximum transverse diameter estimated 3.8 cm.  She recently underwent routine follow-up CT scan and the aorta was reported to be stable but 4.8 cm in diameter.  Cardiothoracic surgical consultation was requested.  Patient is married and lives locally in [place].  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  She exercises religiously and is very active physically.  Her routine exercise regimens include both strenuous aerobic and anaerobic activities.  She specifically denies any recent change in her exercise tolerance.  She specifically denies any recent history of chest pain or chest tightness either with activity or at rest.  She denies any symptoms of exertional shortness of breath.  She states that many years ago she was told that she had mitral valve prolapse.  Past Medical History:  Diagnosis Date  . Allergy   . Ascending aorta enlargement (HCC)    Mild - 3.8 cm  . Asthma   . GERD (gastroesophageal reflux disease)   . Osteoporosis   . Sjogren's disease (HCC) ?    ?    Past Surgical History:  Procedure Laterality Date  . COLONOSCOPY     Dr Matthias Hughs  . KNEE ARTHROSCOPY Right ~2000  . TONSILLECTOMY      Family History  Problem Relation Age of Onset  . Hypertension Mother   . Hyperlipidemia  Father   . Heart attack Father   . Colon cancer Father 6  . Breast cancer Sister   . Diabetes Neg Hx   . Sudden death Neg Hx     Social History   Socioeconomic History  . Marital status: Married    Spouse name: Not on file  . Number of children: 0  . Years of education: Not on file  . Highest education level: Not on file  Occupational History  . Occupation: Soil scientist  Social Needs  . Financial resource strain: Not on file  . Food insecurity:    Worry: Not on file    Inability: Not on file  . Transportation needs:    Medical: Not on file    Non-medical: Not on file  Tobacco Use  . Smoking status: Never Smoker  . Smokeless tobacco: Never Used  Substance and Sexual Activity  . Alcohol use: Yes    Comment: socially  . Drug use: No  . Sexual activity: Not on file  Lifestyle  . Physical activity:    Days per week: Not on file    Minutes per session: Not on file  . Stress: Not on file  Relationships  . Social connections:    Talks on phone: Not on file    Gets together: Not on file    Attends religious service:  Not on file    Active member of club or organization: Not on file    Attends meetings of clubs or organizations: Not on file    Relationship status: Not on file  . Intimate partner violence:    Fear of current or ex partner: Not on file    Emotionally abused: Not on file    Physically abused: Not on file    Forced sexual activity: Not on file  Other Topics Concern  . Not on file  Social History Narrative   Married, no children   Healthcare executive [employer]   1-2 EtOH/week   2-3 caffeine/day   Avid exerciser - long-distance cycling especially   No drug use, no tobacco    Current Outpatient Medications  Medication Sig Dispense Refill  . albuterol (PROVENTIL HFA;VENTOLIN HFA) 108 (90 Base) MCG/ACT inhaler Inhale 2 puffs into the lungs every 6 (six) hours as needed for wheezing or shortness of breath. 1 Inhaler prn  . co-enzyme Q-10 30 MG  capsule Take 30 mg by mouth 3 (three) times daily.    . fish oil-omega-3 fatty acids 1000 MG capsule Take 2 g by mouth daily.    . meloxicam (MOBIC) 7.5 MG tablet Take 7.5 mg by mouth daily.    . Multiple Vitamins-Minerals (MULTIVITAMIN WITH MINERALS) tablet Take 1 tablet by mouth daily.    Marland Kitchen ezetimibe (ZETIA) 10 MG tablet TAKE 1 TABLET (10 MG TOTAL) BY MOUTH DAILY. (Patient taking differently: Take 5 mg by mouth daily. ) 90 tablet 1   No current facility-administered medications for this visit.     Allergies  Allergen Reactions  . Codeine Nausea And Vomiting      Review of Systems:   General:  normal appetite, normal energy, no weight gain, no weight loss, no fever  Cardiac:  no chest pain with exertion, no chest pain at rest, noSOB with no exertion, no resting SOB, no PND, no orthopnea, no palpitations, no arrhythmia, no atrial fibrillation, no LE edema, no dizzy spells, no syncope  Respiratory:  no shortness of breath, no home oxygen, no productive cough, no dry cough, no bronchitis, no wheezing, no hemoptysis, + asthma, no pain with inspiration or cough, no sleep apnea, no CPAP at night  GI:   no difficulty swallowing, no reflux, no frequent heartburn, no hiatal hernia, no abdominal pain, no constipation, no diarrhea, no hematochezia, no hematemesis, no melena  GU:   no dysuria,  no frequency, no urinary tract infection, no hematuria, no kidney stones, no kidney disease  Vascular:  no pain suggestive of claudication, no pain in feet, no leg cramps, no varicose veins, no DVT, no non-healing foot ulcer  Neuro:   no stroke, no TIA's, no seizures, no headaches, no temporary blindness one eye,  no slurred speech, no peripheral neuropathy, no chronic pain, no instability of gait, no memory/cognitive dysfunction  Musculoskeletal: no arthritis, no joint swelling, + myalgias due to recent traumatic injury right biceps tendon, no difficulty walking, normal mobility   Skin:   no rash, no itching,  no skin infections, no pressure sores or ulcerations  Psych:   no anxiety, no depression, no nervousness, no unusual recent stress  Eyes:   no blurry vision, + floaters, no recent vision changes, + wears glasses or contacts  ENT:   no hearing loss, no loose or painful teeth, no dentures, last saw dentist 9 months ago  Hematologic:  no easy bruising, no abnormal bleeding, no clotting disorder, no frequent epistaxis  Endocrine:  no diabetes, does not check CBG's at home     Physical Exam:   BP 124/80 (BP Location: Right Arm, Patient Position: Sitting, Cuff Size: Normal)   Pulse (!) 57   Resp 16   Ht  (1.651 m)   Wt 120 lb (54.4 kg)   SpO2 97%   BMI 19.97 kg/m   General:  no  well-appearing  HEENT:  Unremarkable   Neck:   no JVD, no bruits, no adenopathy   Chest:   clear to auscultation, symmetrical breath sounds, no wheezes, no rhonchi   CV:   RRR, no murmur   Abdomen:  soft, non-tender, no masses   Extremities:  warm, well-perfused, pulses palpable, no LE edema  Rectal/GU  Deferred  Neuro:   Grossly non-focal and symmetrical throughout  Skin:   Clean and dry, no rashes, no breakdown   Diagnostic Tests:  ANGIOGRAPHY CHEST WITH CONTRAST  TECHNIQUE: Multidetector CT imaging of the chest was performed using the standard protocol during bolus administration of intravenous contrast. Multiplanar CT image reconstructions and MIPs were obtained to evaluate the vascular anatomy.  CONTRAST:  75mL ISOVUE-370 IOPAMIDOL (ISOVUE-370) INJECTION 76%  COMPARISON:  Chest CT-04/02/2017  FINDINGS: Vascular Findings:  Grossly unchanged mild fusiform aneurysmal dilatation of the ascending thoracic aorta with measurements as follows. The thoracic aorta tapers to a normal caliber at the level of the aortic arch. The descending thoracic aorta is of normal caliber.  No evidence of thoracic aortic dissection or periaortic stranding on this nongated examination. Review of the  precontrast images are negative for the presence of an intramural hematoma.  Conventional configuration of the aortic arch. The branch vessels of the aortic arch appear widely patent throughout their imaged courses.  Cardiomegaly.  No pericardial effusion.  Although this examination was not tailored for the evaluation the pulmonary arteries, there are no discrete filling defects within the central pulmonary arterial tree to suggest central pulmonary embolism. Normal caliber of the main pulmonary artery.  -------------------------------------------------------------  Thoracic aortic measurements:  Sinotubular junction  29 mm as measured in greatest oblique short axis coronal dimension.  Proximal ascending aorta  38 mm as measured in greatest oblique short axis axial dimension at the level of the main pulmonary artery and approximately 38 mm in greatest oblique short axis coronal diameter (coronal image 47, series 10), unchanged compared to the 04/2017 examination.  Aortic arch aorta  25 mm as measured in greatest oblique short axis sagittal dimension.  Proximal descending thoracic aorta  22 mm as measured in greatest oblique short axis axial dimension at the level of the main pulmonary artery.  Distal descending thoracic aorta  22 mm as measured in greatest oblique short axis axial dimension at the level of the diaphragmatic hiatus.  Review of the MIP images confirms the above findings.  -------------------------------------------------------------  Non-Vascular Findings:  Mediastinum/Lymph Nodes: No bulky mediastinal, hilar or axillary lymphadenopathy.  Lungs/Pleura: Evaluation of the pulmonary parenchyma is degraded secondary to patient respiratory artifact. Minimal dependent subpleural ground-glass atelectasis. No discrete focal airspace opacities. No pleural effusion or pneumothorax. No discrete pulmonary nodules given limitation of the  examination.  Upper abdomen: Early arterial phase evaluation of the upper abdomen is normal.  Musculoskeletal: No acute or aggressive osseous abnormalities. Moderate DDD of C6-C7 with disc space height loss, endplate irregularity and sclerosis.  Regional soft tissues appear normal. Normal appearance of the thyroid gland.  IMPRESSION: 1. Stable uncomplicated fusiform aneurysmal dilatation of the ascending thoracic aorta measuring 48 mm  in diameter. Recommend semi-annual imaging followup by CTA or MRA and referral to cardiothoracic surgery if not already obtained. This recommendation follows 2010 ACCF/AHA/AATS/ACR/ASA/SCA/SCAI/SIR/STS/SVM Guidelines for the Diagnosis and Management of Patients With Thoracic Aortic Disease. Circulation. 2010; 121: Z610-R604. 2. Aortic aneurysm NOS (ICD10-I71.9)   Electronically Signed   By: Simonne Come M.D.   On: 03/25/2018 10:52       Impression:  I have personally reviewed the patient's recent CT angiogram and compared it with the CT scan performed April 02, 2017.  She has mild fusiform enlargement of the ascending thoracic aorta.  Maximum transverse diameter appears stable and measures at most 3.8 cm on her recent scan.  This measurement may be generous because of the tendency for motion artifact in a non-gated scan to overestimate the size of the aorta.  The final report of the scan documents a maximum diameter 4.8 cm which is clearly inaccurate.  I suspect this is a typographical error.  There is no sign of aortic dissection nor other complicating features.  The descending thoracic aorta is normal in both diameter and appearance.  Patient does not have any immediate family members with any history of Marfan's disease or other inheritable collagen vascular diseases, and there is no family history of aortic dissection or sudden death.   Plan:  I have discussed the results of the patient's recent CT scan and directly reviewed images with  the patient in the office today.  Given the relatively small size and the stability of the size of the aorta over the past year, I feel less inclined to proceed with biannual imaging as recommended by the interpreting radiologist.  Options include follow-up at 1 or 2 years, and personally I feel that imaging at 2 years would be more than adequate in this otherwise healthy patient with no history of hypertension or bicuspid aortic valve disease.  Transthoracic echocardiogram to rule out the presence of bicuspid aortic valve disease might be reasonable, particularly given patient's history that she had been told that she had a heart murmur and possible mitral valve prolapse in the past.  We discussed the association of hypertension with increased risk of complications such as acute aortic dissection.  I have reassured the patient that her proximal aorta is only mildly enlarged and probably at very low risk for further enlargement and/or complications in the future.  All her questions have been addressed.   I spent in excess of 20 minutes during the conduct of this office consultation and >50% of this time involved direct face-to-face encounter with the patient for counseling and/or coordination of their care.    Salvatore Decent. Cornelius Moras, MD 03/28/2018 11:57 AM

## 2018-03-28 NOTE — Patient Instructions (Addendum)
Continue all previous medications without any changes at this time  Monitor your blood pressure regularly and discuss whether or not your blood pressure medications should be adjusted with your cardiologist and/or primary care physician, particularly if your systolic blood pressure consistently measures > 140 mmHg and/or your diastolic blood pressure measures > 90 mmHg  

## 2018-03-29 MED FILL — VENTOLIN HFA 90 MCG INHALER: 108 (90 BAS | 25 days supply | Qty: 18 | Fill #2

## 2018-04-12 ENCOUNTER — Ambulatory Visit: Payer: 59 | Admitting: Sports Medicine

## 2018-04-13 ENCOUNTER — Ambulatory Visit (HOSPITAL_COMMUNITY): Payer: 59 | Attending: Cardiovascular Disease

## 2018-04-13 ENCOUNTER — Other Ambulatory Visit: Payer: Self-pay

## 2018-04-13 DIAGNOSIS — Z418 Encounter for other procedures for purposes other than remedying health state: Secondary | ICD-10-CM

## 2018-04-13 DIAGNOSIS — I7789 Other specified disorders of arteries and arterioles: Secondary | ICD-10-CM | POA: Diagnosis not present

## 2018-04-13 MED ORDER — OSELTAMIVIR PHOSPHATE 75 MG PO CAPS: 75.0000 mg | ORAL_CAPSULE | Freq: Every day | ORAL | 0 refills | Status: DC

## 2018-04-13 MED FILL — OSELTAMIVIR PHOSPHATE 75 MG: 75 | 7 days supply | Qty: 7 | Fill #0

## 2018-04-21 ENCOUNTER — Ambulatory Visit: Payer: 59 | Admitting: Sports Medicine

## 2018-04-21 ENCOUNTER — Other Ambulatory Visit: Payer: Self-pay

## 2018-04-21 ENCOUNTER — Ambulatory Visit: Payer: Self-pay

## 2018-04-21 ENCOUNTER — Encounter: Payer: Self-pay | Admitting: Sports Medicine

## 2018-04-21 VITALS — BP 116/80 | Ht 65.0 in | Wt 120.0 lb

## 2018-04-21 DIAGNOSIS — S46219A Strain of muscle, fascia and tendon of other parts of biceps, unspecified arm, initial encounter: Secondary | ICD-10-CM | POA: Diagnosis not present

## 2018-04-21 DIAGNOSIS — M5412 Radiculopathy, cervical region: Secondary | ICD-10-CM | POA: Insufficient documentation

## 2018-04-21 MED ORDER — GABAPENTIN 300 MG PO CAPS: 300.0000 mg | ORAL_CAPSULE | Freq: Every day | ORAL | 2 refills | Status: DC

## 2018-04-21 MED FILL — GABAPENTIN 300 MG CAPSULE: 300 | 30 days supply | Qty: 30 | Fill #0 | Status: TO

## 2018-04-21 NOTE — Assessment & Plan Note (Signed)
Trial on gabapentin 300 HS Will gradually increase as needed  Neck exercise and motion series  Reck 2 mos

## 2018-04-21 NOTE — Assessment & Plan Note (Signed)
This will gradually improve  Strength is OK  Pain on resistance only mild  Not the cause of her scapular pain

## 2018-04-21 NOTE — Progress Notes (Signed)
CC: RT scapular pain  Patient with severe RT scapular pain at night primarily Sept she started with shoulder issues Partial biceps tear improved with CSI and treatment Saw me on 1/28 and as compared to MRI in Nov. The beceps tendon tear was almost complete now  However, biceps pain was primarily with too much activity This has improved somewhat with HEP  At night though she gets pain too severe to sleep Worse rolling over to RT Goes down into RT arm This is located over scapula and not biceps area  CT of aorta showed C6/7 dosc degeneration On my review she has some probable thoracic spondylolithesis at T3/4  ROS Pain in rt arm with lifting primarily No cough or sneeze pain Pain in RT scapula alleviated by lying on left side/ worsened by lying on RT No numbness  P/E Thin F in NAD BP 116/80   Ht 5\' 5"  (1.651 m)   Wt 120 lb (54.4 kg)   BMI 19.97 kg/m   Shoulder:RT Inspection reveals no abnormalities, atrophy or asymmetry. Palpation is normal with no tenderness over AC joint or bicipital groove. ROM is full in all planes. Rotator cuff strength normal throughout. No signs of impingement with negative Neer and Hawkin's tests, empty can. Speeds and Yergason's tests normal but do cause mild to moderate pain Mild step-off at biceps prox tendon No labral pathology noted with negative Obrien's, negative clunk and good stability. Normal scapular function observed. No painful arc and no drop arm sign. No apprehension sign  Neck Has some pain with maximum extensino Good flexion Good rotation to left but limited to RT Decreased RT lateral bend  Neuro: nls DTRs and sensation

## 2018-04-25 MED FILL — VENTOLIN HFA 90 MCG INHALER: 108 (90 BAS | 25 days supply | Qty: 18 | Fill #3

## 2018-06-04 MED FILL — GABAPENTIN 300 MG CAPSULE: 300 | 30 days supply | Qty: 30 | Fill #0

## 2018-06-23 ENCOUNTER — Ambulatory Visit: Payer: 59 | Admitting: Sports Medicine

## 2018-06-23 ENCOUNTER — Telehealth: Payer: Self-pay | Admitting: Plastic Surgery

## 2018-06-23 ENCOUNTER — Other Ambulatory Visit: Payer: Self-pay

## 2018-06-23 ENCOUNTER — Encounter: Payer: Self-pay | Admitting: Sports Medicine

## 2018-06-23 VITALS — BP 110/70 | Ht 65.0 in | Wt 120.0 lb

## 2018-06-23 DIAGNOSIS — M7501 Adhesive capsulitis of right shoulder: Secondary | ICD-10-CM | POA: Diagnosis not present

## 2018-06-23 DIAGNOSIS — M25511 Pain in right shoulder: Secondary | ICD-10-CM

## 2018-06-23 MED ORDER — GABAPENTIN 300 MG PO CAPS: 300.0000 mg | ORAL_CAPSULE | Freq: Three times a day (TID) | ORAL | 2 refills | Status: DC

## 2018-06-23 NOTE — Telephone Encounter (Signed)

## 2018-06-23 NOTE — Progress Notes (Signed)
  Jacqueline Henry - 63 y.o. female MRN [id number]  Date of birth: 1955/09/20    SUBJECTIVE:      Chief Complaint: Right shoulder pain  HPI:  63 year old female presents for follow-up for right shoulder pain.  She was previously treated for a biceps tendon tear.  She was last seen about 6 weeks ago at which time she was started on gabapentin 300 mg nightly and has been doing home rehab exercises.  Since that time she notes improvement in her sleep with the gabapentin.   Overall not having increased pain, but still noting fairly significant pain while performing activities with her arm outstretched.  However, she has experienced a significant decrease in range of motion at the shoulder.  She has slightly limited range of motion in abduction and flexion, but severely limited range of motion in external and internal rotation.  She denies any neck pain.  No numbness or tingling in the upper extremity.  No associated skin changes  ROS:     See HPI. All other reviewed systems negative.  PERTINENT  PMH / PSH FH / / SH:  Past Medical, Surgical, Social, and Family History Reviewed & Updated in the EMR.    OBJECTIVE: BP 110/70   Ht 5\' 5"  (1.651 m)   Wt 120 lb (54.4 kg)   BMI 19.97 kg/m   Physical Exam:  Vital signs are reviewed.  GEN: Alert and oriented, NAD Pulm: Breathing unlabored PSY: normal mood, congruent affect  MSK: Right shoulder: No obvious deformity or asymmetry. No bruising. No swelling Tenderness anteriorly over the proximal biceps tendon Limited range of motion: Flexion and abduction lacking about 20 degrees of motion compared to the left.  She is only able to externally rotate about 20-25 degrees.  Internal rotation only to about the posterior hip NV intact distally She has good strength with rotator cuff testing. + Frozen shoulder test  Left shoulder: Full range of motion in flexion, abduction, IR, and ER.  External rotation is to nearly 90 degrees.  External rotation is to  about T4 N/V intact distally 5/5 strength with rotator cuff testing   ASSESSMENT & PLAN:  1.  Right shoulder-patient has known biceps tendon tear in the right shoulder.  It seems that at this point she has begun to also develop an adhesive capsulitis with range of motion most significant in internal and external rotation. -Will increase gabapentin to 300 mg 3 times daily gradually over the next 2 weeks. - Discussed and reviewed home exercises program for capsular stretching -Discouraged strength training -Tylenol or NSAIDs as needed - Follow-up 6 weeks  I observed and examined the patient with the Dr. Lenon Curt and agree with assessment and plan.  Note reviewed and modified by me. Sterling Big, MD

## 2018-06-24 ENCOUNTER — Ambulatory Visit (INDEPENDENT_AMBULATORY_CARE_PROVIDER_SITE_OTHER): Payer: Self-pay | Admitting: Plastic Surgery

## 2018-06-24 ENCOUNTER — Encounter: Payer: Self-pay | Admitting: Plastic Surgery

## 2018-06-24 DIAGNOSIS — Z719 Counseling, unspecified: Secondary | ICD-10-CM

## 2018-06-24 NOTE — Progress Notes (Signed)
Botulinum Toxin Procedure Note  Procedure: Cosmetic botulinum toxin   Pre-operative Diagnosis: Dynamic rhytides  Post-operative Diagnosis: Same  Complications:  None  Brief history: The patient desires botulinum toxin injection of her forehead. I discussed with the patient this proposed procedure of botulinum toxin injections, which is customized depending on the particular needs of the patient. It is performed on facial rhytids as a temporary correction. The alternatives were discussed with the patient. The risks were addressed including bleeding, scarring, infection, damage to deeper structures, asymmetry, and chronic pain, which may occur infrequently after a procedure. The individual's choice to undergo a surgical procedure is based on the comparison of risks to potential benefits. Other risks include unsatisfactory results, brow ptosis, eyelid ptosis, allergic reaction, temporary paralysis, which should go away with time, bruising, blurring disturbances and delayed healing. Botulinum toxin injections do not arrest the aging process or produce permanent tightening of the eyelid.  Operative intervention maybe necessary to maintain the results of a blepharoplasty or botulinum toxin. The patient understands and wishes to proceed. An informed consent was signed and informational brochures given to her prior to the procedure.  Procedure: The area was prepped with alcohol and dried with a clean gauze. Using a clean technique, the botulinum toxin was diluted with 1.25 cc of preservative-free normal saline which was slowly injected with an 18 gauge needle in a tuberculin syringes.  A 32 gauge needles were then used to inject the botulinum toxin. This mixture allow for an aliquot of 5 units per 0.1 cc in each injection site.    Subsequently the mixture was injected in the glabellar and forehead area with preservation of the temporal branch to the lateral eyebrow as well as into each lateral canthal area  beginning from the lateral orbital rim medial to the zygomaticus major in 3 separate areas. A total of 36 Units of botulinum toxin was used. The forehead and glabellar area was injected with care to inject intramuscular only while holding pressure on the supratrochlear vessels in each area during each injection on either side of the medial corrugators. The injection proceeded vertically superiorly to the medial 2/3 of the frontalis muscle and superior 2/3 of the lateral frontalis, again with preservation of the frontal branch.  No complications were noted. Light pressure was held for 5 minutes. She was instructed explicitly in post-operative care.  Botox LOT:  [id number] C2 EXP:  9/22

## 2018-06-28 MED FILL — GABAPENTIN 300 MG CAPSULE: 300 | 30 days supply | Qty: 90 | Fill #0

## 2018-08-04 ENCOUNTER — Ambulatory Visit: Payer: 59 | Admitting: Sports Medicine

## 2018-08-16 ENCOUNTER — Other Ambulatory Visit: Payer: Self-pay

## 2018-08-16 ENCOUNTER — Encounter: Payer: Self-pay | Admitting: Sports Medicine

## 2018-08-16 ENCOUNTER — Ambulatory Visit: Payer: 59 | Admitting: Sports Medicine

## 2018-08-16 DIAGNOSIS — M5412 Radiculopathy, cervical region: Secondary | ICD-10-CM | POA: Diagnosis not present

## 2018-08-16 DIAGNOSIS — S46219A Strain of muscle, fascia and tendon of other parts of biceps, unspecified arm, initial encounter: Secondary | ICD-10-CM

## 2018-08-16 NOTE — Assessment & Plan Note (Signed)
This had resulted in a frozen shoulder That is much improved Biceps strength good now Much less pain  Will gradually add strength work Still continue Codman exercises and work ROM  Reck 2 mos pending response

## 2018-08-16 NOTE — Progress Notes (Signed)
CC: Frozen shoulder RT  Partial biceps tendon tear in fall By March 2020 biceps tendon tear nearly complete and had developed more severe pain Had night pain Was also noted to have c6/7 dosc degeneration  By 5/21 had > 20 deg. 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 IR anteriorly is fine On back scratch can get to belt but still tight and limited Elevation is 5 deg. Less  Impingement testing neg Strength was good No pain on speeds or yergasons

## 2018-08-16 NOTE — Assessment & Plan Note (Signed)
I think the gabapentin helped get this in control enough to start recovering Sleep pattern is much better ROM improving  Will continue Gabapentin 300 hs

## 2018-09-30 ENCOUNTER — Other Ambulatory Visit: Payer: 59 | Admitting: Internal Medicine

## 2018-09-30 ENCOUNTER — Other Ambulatory Visit: Payer: Self-pay

## 2018-09-30 DIAGNOSIS — M810 Age-related osteoporosis without current pathological fracture: Secondary | ICD-10-CM

## 2018-09-30 DIAGNOSIS — E7800 Pure hypercholesterolemia, unspecified: Secondary | ICD-10-CM

## 2018-09-30 DIAGNOSIS — K219 Gastro-esophageal reflux disease without esophagitis: Secondary | ICD-10-CM | POA: Diagnosis not present

## 2018-09-30 DIAGNOSIS — I77810 Thoracic aortic ectasia: Secondary | ICD-10-CM

## 2018-10-01 LAB — CBC WITH DIFFERENTIAL/PLATELET
Absolute Monocytes: 318 cells/uL (ref 200–950)
Basophils Absolute: 60 cells/uL (ref 0–200)
Basophils Relative: 1.4 %
Eosinophils Absolute: 168 cells/uL (ref 15–500)
Eosinophils Relative: 3.9 %
HCT: 44.4 % (ref 35.0–45.0)
Hemoglobin: 14.6 g/dL (ref 11.7–15.5)
Lymphs Abs: 1299 cells/uL (ref 850–3900)
MCH: 28.9 pg (ref 27.0–33.0)
MCHC: 32.9 g/dL (ref 32.0–36.0)
MCV: 87.7 fL (ref 80.0–100.0)
MPV: 11.4 fL (ref 7.5–12.5)
Monocytes Relative: 7.4 %
Neutro Abs: 2455 cells/uL (ref 1500–7800)
Neutrophils Relative %: 57.1 %
Platelets: 208 10*3/uL (ref 140–400)
RBC: 5.06 10*6/uL (ref 3.80–5.10)
RDW: 13.1 % (ref 11.0–15.0)
Total Lymphocyte: 30.2 %
WBC: 4.3 10*3/uL (ref 3.8–10.8)

## 2018-10-01 LAB — COMPLETE METABOLIC PANEL WITH GFR
AG Ratio: 2 (calc) (ref 1.0–2.5)
ALT: 14 U/L (ref 6–29)
AST: 18 U/L (ref 10–35)
Albumin: 4.5 g/dL (ref 3.6–5.1)
Alkaline phosphatase (APISO): 64 U/L (ref 37–153)
BUN: 18 mg/dL (ref 7–25)
CO2: 27 mmol/L (ref 20–32)
Calcium: 9.6 mg/dL (ref 8.6–10.4)
Chloride: 106 mmol/L (ref 98–110)
Creat: 0.96 mg/dL (ref 0.50–0.99)
GFR, Est African American: 73 mL/min/{1.73_m2} (ref >=60)
GFR, Est Non African American: 63 mL/min/{1.73_m2} (ref >=60)
Globulin: 2.3 g/dL (calc) (ref 1.9–3.7)
Glucose, Bld: 88 mg/dL (ref 65–99)
Potassium: 4.8 mmol/L (ref 3.5–5.3)
Sodium: 139 mmol/L (ref 135–146)
Total Bilirubin: 0.8 mg/dL (ref 0.2–1.2)
Total Protein: 6.8 g/dL (ref 6.1–8.1)

## 2018-10-01 LAB — LIPID PANEL
Cholesterol: 204 mg/dL — ABNORMAL HIGH (ref <200)
HDL: 64 mg/dL (ref >=50)
LDL Cholesterol (Calc): 126 mg/dL (calc) — ABNORMAL HIGH
Non-HDL Cholesterol (Calc): 140 mg/dL (calc) — ABNORMAL HIGH (ref <130)
Total CHOL/HDL Ratio: 3.2 (calc) (ref <5.0)
Triglycerides: 58 mg/dL (ref <150)

## 2018-10-01 LAB — TSH: TSH: 1.87 mIU/L (ref 0.40–4.50)

## 2018-10-01 LAB — VITAMIN D 25 HYDROXY (VIT D DEFICIENCY, FRACTURES): Vit D, 25-Hydroxy: 46 ng/mL (ref 30–100)

## 2018-10-05 ENCOUNTER — Other Ambulatory Visit: Payer: 59 | Admitting: Internal Medicine

## 2018-10-06 ENCOUNTER — Encounter: Payer: No Typology Code available for payment source | Admitting: Internal Medicine

## 2018-10-06 ENCOUNTER — Other Ambulatory Visit: Payer: 59 | Admitting: Internal Medicine

## 2018-10-12 ENCOUNTER — Other Ambulatory Visit: Payer: Self-pay

## 2018-10-12 ENCOUNTER — Encounter: Payer: Self-pay | Admitting: Internal Medicine

## 2018-10-12 ENCOUNTER — Ambulatory Visit (INDEPENDENT_AMBULATORY_CARE_PROVIDER_SITE_OTHER): Payer: 59 | Admitting: Internal Medicine

## 2018-10-12 VITALS — BP 110/70 | HR 57 | Temp 98.3°F | Ht 65.0 in | Wt 121.0 lb

## 2018-10-12 DIAGNOSIS — M7541 Impingement syndrome of right shoulder: Secondary | ICD-10-CM

## 2018-10-12 DIAGNOSIS — Z23 Encounter for immunization: Secondary | ICD-10-CM | POA: Diagnosis not present

## 2018-10-12 DIAGNOSIS — M8580 Other specified disorders of bone density and structure, unspecified site: Secondary | ICD-10-CM | POA: Diagnosis not present

## 2018-10-12 DIAGNOSIS — R17 Unspecified jaundice: Secondary | ICD-10-CM | POA: Diagnosis not present

## 2018-10-12 DIAGNOSIS — I77810 Thoracic aortic ectasia: Secondary | ICD-10-CM

## 2018-10-12 DIAGNOSIS — E7800 Pure hypercholesterolemia, unspecified: Secondary | ICD-10-CM

## 2018-10-12 DIAGNOSIS — Z0000 Encounter for general adult medical examination without abnormal findings: Secondary | ICD-10-CM | POA: Diagnosis not present

## 2018-10-12 DIAGNOSIS — F5102 Adjustment insomnia: Secondary | ICD-10-CM | POA: Diagnosis not present

## 2018-10-12 LAB — POCT URINALYSIS DIPSTICK
Appearance: NEGATIVE
Bilirubin, UA: NEGATIVE
Blood, UA: NEGATIVE
Glucose, UA: NEGATIVE
Ketones, UA: NEGATIVE
Leukocytes, UA: NEGATIVE
Nitrite, UA: NEGATIVE
Odor: NEGATIVE
Protein, UA: NEGATIVE
Spec Grav, UA: 1.01 (ref 1.010–1.025)
Urobilinogen, UA: 0.2 E.U./dL
pH, UA: 6.5 (ref 5.0–8.0)
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
## 2018-10-18 ENCOUNTER — Other Ambulatory Visit: Payer: Self-pay

## 2018-10-18 ENCOUNTER — Ambulatory Visit (INDEPENDENT_AMBULATORY_CARE_PROVIDER_SITE_OTHER): Payer: Self-pay | Admitting: Plastic Surgery

## 2018-10-18 ENCOUNTER — Encounter: Payer: Self-pay | Admitting: Plastic Surgery

## 2018-10-18 VITALS — BP 124/82 | HR 55 | Temp 97.1°F

## 2018-10-18 DIAGNOSIS — Z719 Counseling, unspecified: Secondary | ICD-10-CM

## 2018-10-18 NOTE — Progress Notes (Signed)
Botulinum Toxic Procedure Note  Procedure: Cosmetic botulinum toxin  Pre-operative Diagnosis: Dynamic rhytides  Post-operative Diagnosis: Same  Complications:  None  Brief history: The patient desires botulinum toxin injection of her forehead. I discussed with the patient this proposed procedure of botulinum toxin injections, which is customized depending on the particular needs of the patient. It is performed on facial rhytids as a temporary correction. The alternatives were discussed with the patient. The risks were addressed including bleeding, scarring, infection, damage to deeper structures, asymmetry, and chronic pain, which may occur infrequently after a procedure. The individual's choice to undergo a surgical procedure is based on the comparison of risks to potential benefits. Other risks include unsatisfactory results, brow ptosis, eyelid ptosis, allergic reaction, temporary paralysis, which should go away with time, bruising, blurring disturbances and delayed healing. Botulinum toxin injections do not arrest the aging process or produce permanent tightening of the eyelid.  Operative intervention maybe necessary to maintain the results of a blepharoplasty or botulinum toxin. The patient understands and wishes to proceed. An informed consent was signed and informational brochures given to her prior to the procedure.  Procedure: The area was prepped with alcohol and dried with a clean gauze. Using a clean technique, the botulinum toxin was diluted with 1.25 cc of preservative-free normal saline which was slowly injected with an 18 gauge needle in a tuberculin syringes.  A 32 gauge needles were then used to inject the botulinum toxin. This mixture allow for an aliquot of 5 units per 0.1 cc in each injection site.    Subsequently the mixture was injected in the glabellar and forehead area with preservation of the temporal branch to the lateral eyebrow as well as into each lateral canthal area  beginning from the lateral orbital rim medial to the zygomaticus major in 3 separate areas. A total of 32 Units of botulinum toxin was used. The forehead and glabellar area was injected with care to inject intramuscular only while holding pressure on the supratrochlear vessels in each area during each injection on either side of the medial corrugators. The injection proceeded vertically superiorly to the medial 2/3 of the frontalis muscle and superior 2/3 of the lateral frontalis, again with preservation of the frontal branch. No complications were noted. Light pressure was held for 5 minutes. She was instructed explicitly in post-operative care.  Botox LOT:  [id number] C2 EXP:  4/23

## 2018-10-27 MED ORDER — ALPRAZOLAM 0.5 MG PO TABS: ORAL_TABLET | ORAL | 2 refills | Status: DC

## 2018-10-27 MED FILL — ALPRAZolam 0.5 MG TABS: 0.5 | 30 days supply | Qty: 60 | Fill #0

## 2018-10-27 NOTE — Telephone Encounter (Signed)
Have e-scribed Xanax 0.5 mg twice daily if needed for anxiety or sleep

## 2018-10-31 MED FILL — EZETIMIBE 10 MG TABS: 10 | 90 days supply | Qty: 90 | Fill #1

## 2019-01-19 ENCOUNTER — Telehealth (INDEPENDENT_AMBULATORY_CARE_PROVIDER_SITE_OTHER): Payer: 59 | Admitting: Sports Medicine

## 2019-01-19 DIAGNOSIS — M25551 Pain in right hip: Secondary | ICD-10-CM | POA: Insufficient documentation

## 2019-01-19 NOTE — Progress Notes (Signed)
Patient agreed to video visit.  She understands limitations.  Patient at home in [place].  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

## 2019-01-19 NOTE — Assessment & Plan Note (Signed)
Given 4 way straight leg raises 3 sets of 15 leg weight When this is easy add ankle weight  OK to continue activities that are not painful  Reassured that this is not hip joint and not likely SIJ  Reck if not resolving

## 2019-03-22 ENCOUNTER — Encounter: Payer: Self-pay | Admitting: Plastic Surgery

## 2019-03-25 DIAGNOSIS — R519 Headache, unspecified: Secondary | ICD-10-CM | POA: Diagnosis not present

## 2019-03-25 DIAGNOSIS — W19XXXA Unspecified fall, initial encounter: Secondary | ICD-10-CM | POA: Diagnosis not present

## 2019-03-25 DIAGNOSIS — Z20822 Contact with and (suspected) exposure to covid-19: Secondary | ICD-10-CM | POA: Diagnosis not present

## 2019-03-25 DIAGNOSIS — T07XXXA Unspecified multiple injuries, initial encounter: Secondary | ICD-10-CM | POA: Diagnosis not present

## 2019-03-25 DIAGNOSIS — R110 Nausea: Secondary | ICD-10-CM | POA: Diagnosis not present

## 2019-03-28 ENCOUNTER — Telehealth (INDEPENDENT_AMBULATORY_CARE_PROVIDER_SITE_OTHER): Payer: 59 | Admitting: Sports Medicine

## 2019-03-28 DIAGNOSIS — M25562 Pain in left knee: Secondary | ICD-10-CM

## 2019-03-28 DIAGNOSIS — R0781 Pleurodynia: Secondary | ICD-10-CM | POA: Diagnosis not present
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
## 2019-03-28 NOTE — Assessment & Plan Note (Signed)
Considering her difficulty with normal gait and ongoing pain I would like to see her in the office for a formal knee exam and ultrasound.  In the meantime she should continue icing and doing easy motion exercises.  I spent with this patient. Over 50% of visit was spend in counseling and coordination of care for problems with knee and rib cage pain.

## 2019-03-30 ENCOUNTER — Ambulatory Visit (INDEPENDENT_AMBULATORY_CARE_PROVIDER_SITE_OTHER): Payer: 59 | Admitting: Sports Medicine

## 2019-03-30 ENCOUNTER — Ambulatory Visit: Payer: Self-pay

## 2019-03-30 ENCOUNTER — Other Ambulatory Visit: Payer: Self-pay

## 2019-03-30 VITALS — BP 128/86 | Ht 65.0 in | Wt 120.0 lb

## 2019-03-30 DIAGNOSIS — M25562 Pain in left knee: Secondary | ICD-10-CM

## 2019-03-31 ENCOUNTER — Ambulatory Visit (HOSPITAL_BASED_OUTPATIENT_CLINIC_OR_DEPARTMENT_OTHER)
Admission: RE | Admit: 2019-03-31 | Discharge: 2019-03-31 | Disposition: A | Discharge status: Home / Self Care | Payer: 59 | Source: Ambulatory Visit | Attending: Sports Medicine | Admitting: Sports Medicine

## 2019-03-31 DIAGNOSIS — S8992XA Unspecified injury of left lower leg, initial encounter: Secondary | ICD-10-CM | POA: Diagnosis not present

## 2019-03-31 DIAGNOSIS — M25562 Pain in left knee: Secondary | ICD-10-CM | POA: Diagnosis not present
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
## 2019-04-26 ENCOUNTER — Telehealth: Payer: Self-pay | Admitting: Internal Medicine

## 2019-04-26 NOTE — Telephone Encounter (Signed)
LVM to CB and scheduled 6 month follow up with Lipid, liver

## 2019-05-12 ENCOUNTER — Other Ambulatory Visit: Payer: Self-pay | Admitting: Internal Medicine

## 2019-05-12 DIAGNOSIS — Z1231 Encounter for screening mammogram for malignant neoplasm of breast: Secondary | ICD-10-CM

## 2019-05-29 IMAGING — RF DG ESOPHAGUS
11 of 15 series · 13 of 24 positions shown · non-contrast
Comparison: EGD report 03/11/2017 CT Abdomen and Pelvis 06/05/2016.

CLINICAL DATA: 61-year-old female with unexplained on set of globus
sensation, GERD, heartburn, increased belching last fall. Heartburn
symptoms have been improved on proton pump inhibitor, but globus
sensation and belching persists. Unrevealing EGD earlier this month.
Subsequent encounter.

EXAM:
ESOPHOGRAM / BARIUM SWALLOW / BARIUM TABLET STUDY
TECHNIQUE: Combined double contrast and single contrast examination performed
using effervescent crystals, thick barium liquid, and thin barium
liquid. The patient was observed with fluoroscopy swallowing a 13 mm
barium sulphate tablet.
FLUOROSCOPY TIME:  Fluoroscopy Time:  1 min 36 sec
Radiation Exposure Index (if provided by the fluoroscopic device):
Number of Acquired Spot Images: 0

[Series 1: cp_standard · 0.51mm/px · 2 of 26 frames shown (1 of 11)]
[frame 1/26]
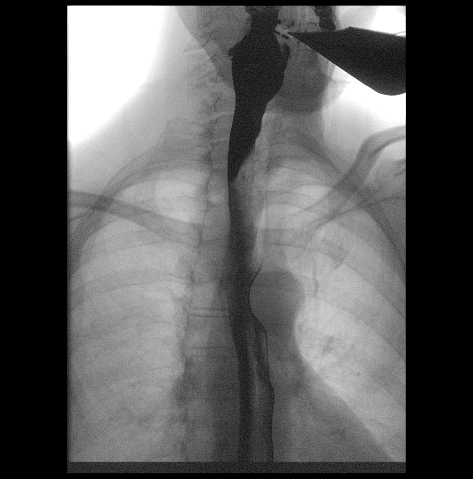
[frame 23/26]
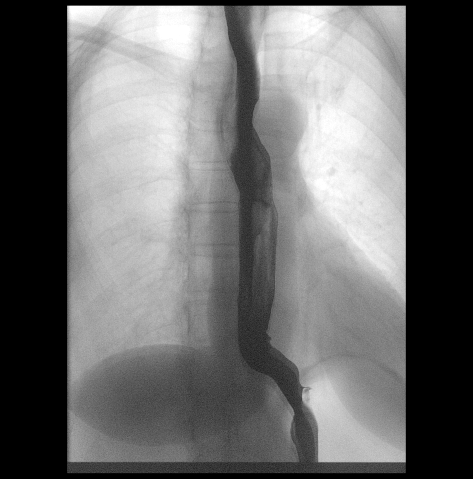

[Series 5: cp_standard · 0.17mm/px · 1 of 1 slices shown (2 of 11)]
[im 1/1]
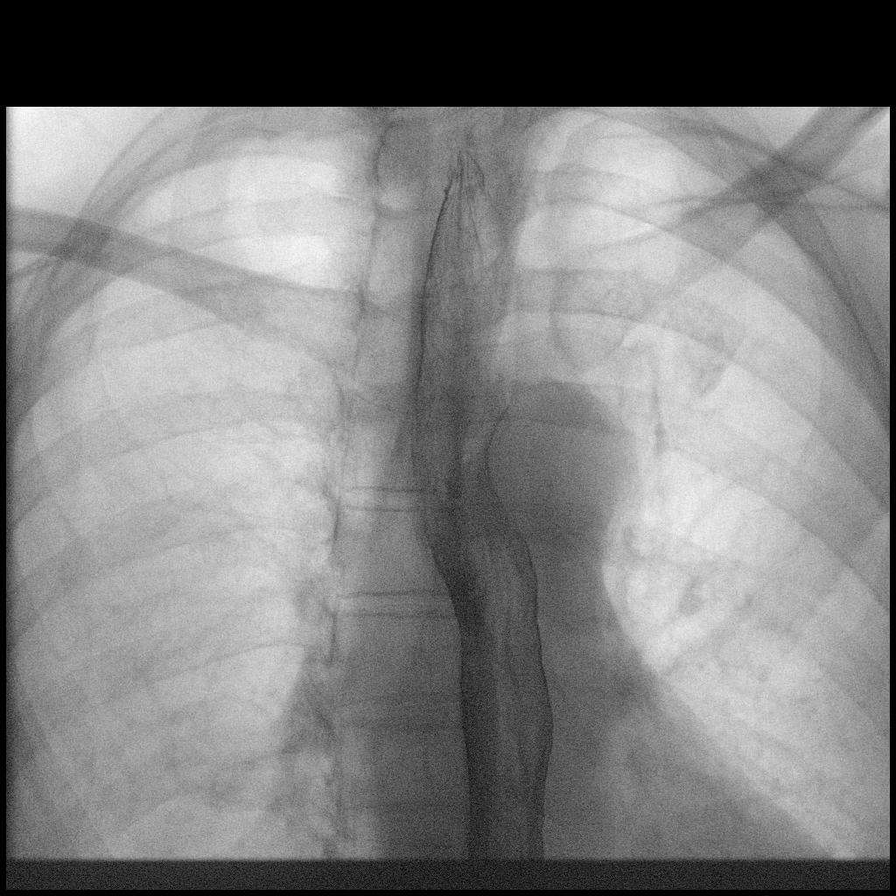

[Series 7: cp_standard · 0.34mm/px · 1 of 17 frames shown (3 of 11)]
[frame 9/17]
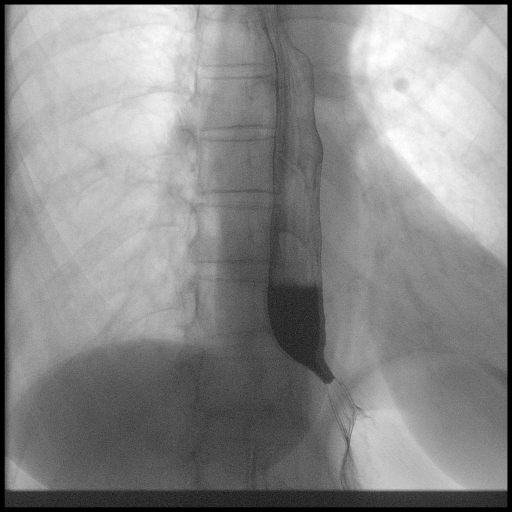

[Series 9: cp_standard · 0.51mm/px · 1 of 19 frames shown (4 of 11)]
[frame 3/19]
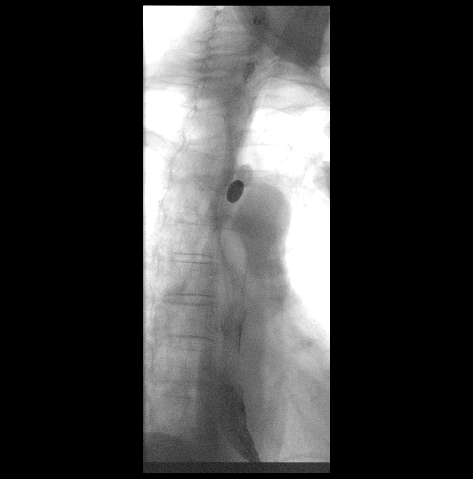

[Series 10: cp_standard · 0.34mm/px · 2 of 21 frames shown (5 of 11)]
[frame 4/21]
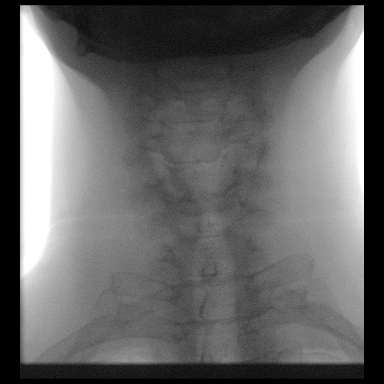
[frame 18/21]
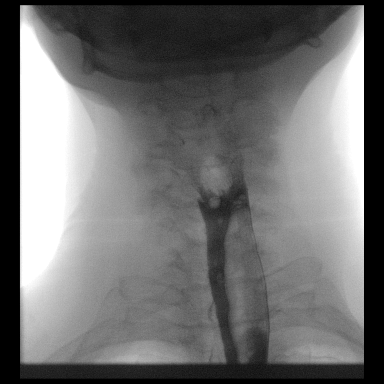

[Series 11: cp_standard · 0.17mm/px · 1 of 1 slices shown (6 of 11)]
[im 1/1]
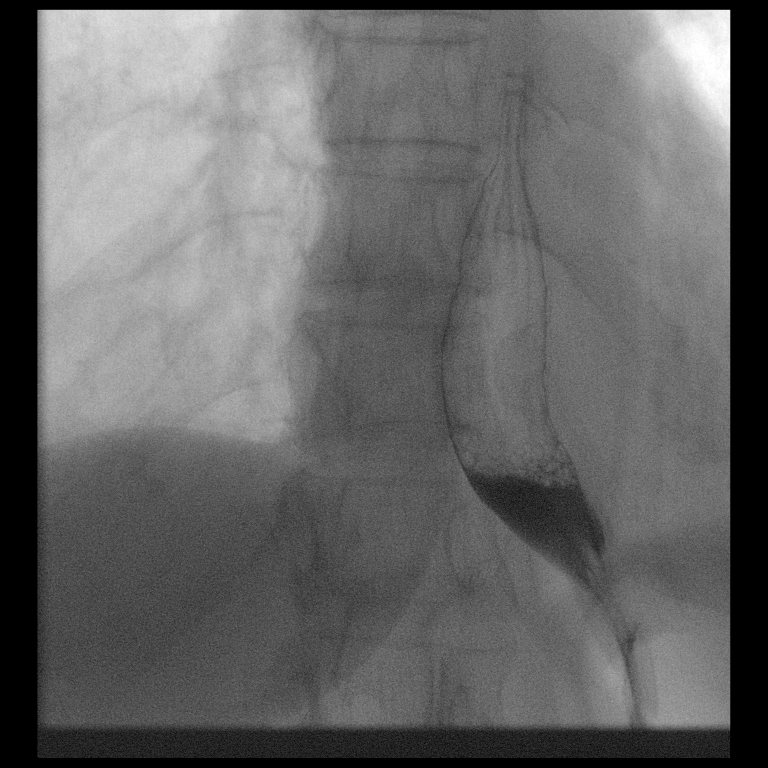

[Series 12: cp_standard · 0.34mm/px · 1 of 20 frames shown (7 of 11)]
[frame 18/20]
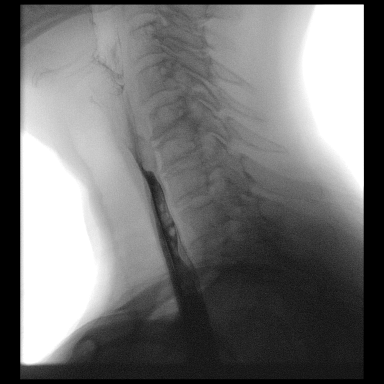

[Series 13: cp_standard · 0.34mm/px · 1 of 15 frames shown (8 of 11)]
[frame 8/15]
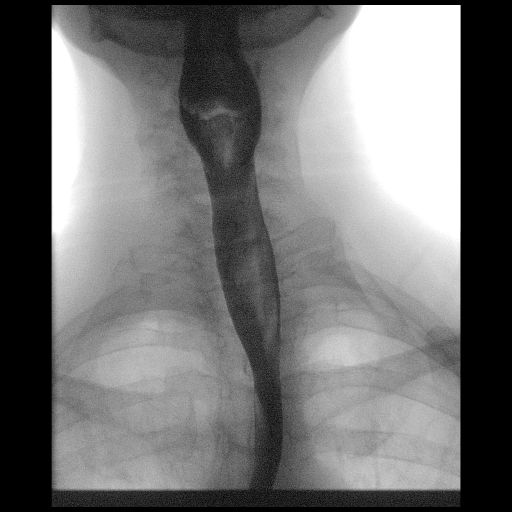

[Series 14: cp_standard · 0.35mm/px · 1 of 55 frames shown (9 of 11)]
[frame 28/55]
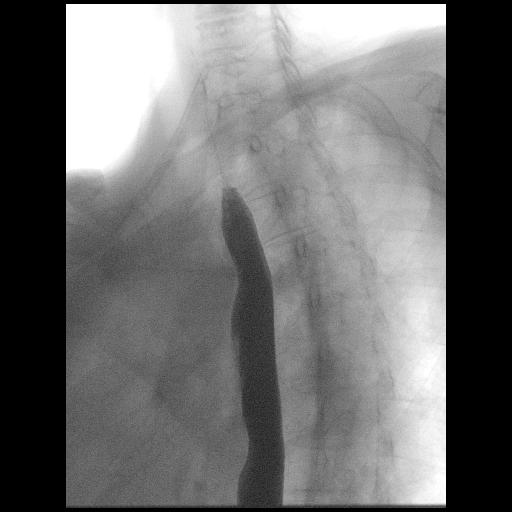

[Series 16: cp_standard · 0.26mm/px · 1 of 1 slices shown (10 of 11)]
[im 1/1]
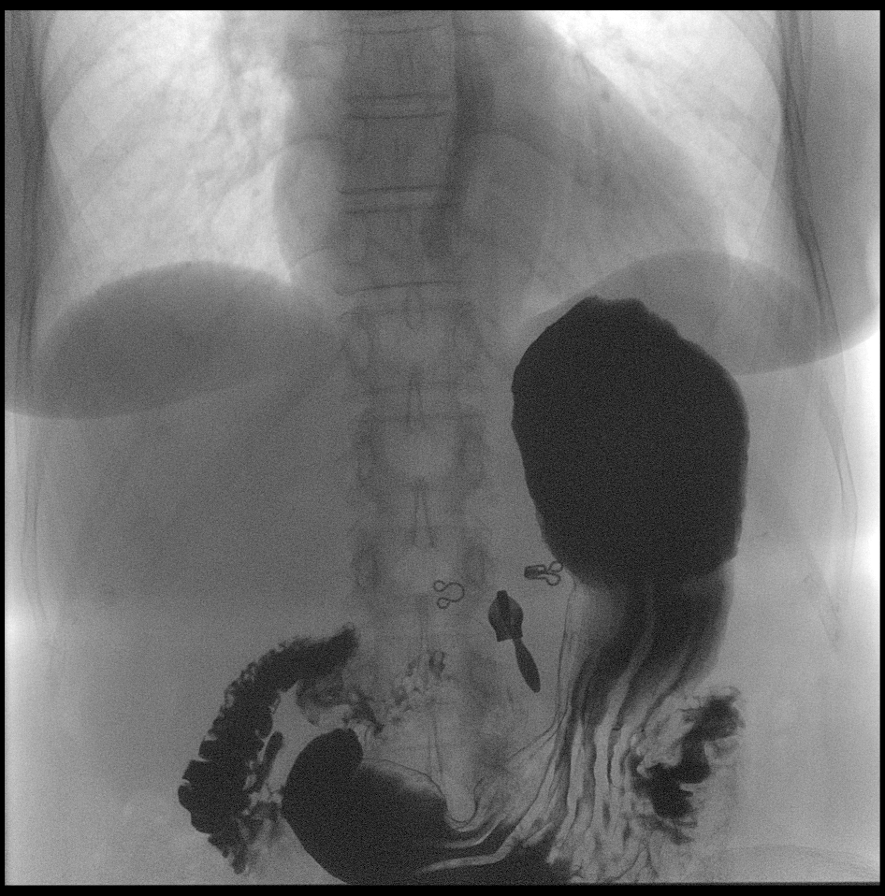

[Series 19: cp_standard · 0.26mm/px · 1 of 1 slices shown (11 of 11)]
[im 1/1]
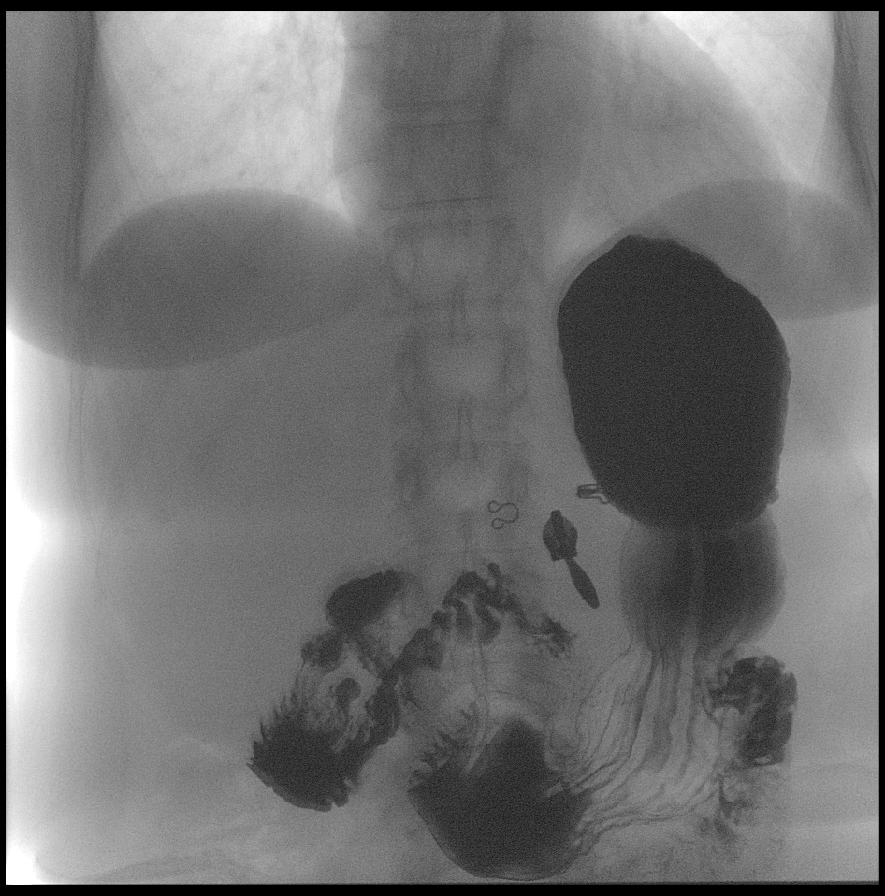

[13 of 24 positions shown; findings below may reference images not displayed]

FINDINGS: A double contrast study was undertaken and the patient tolerated
this well and without difficulty.

No obstruction to the forward flow of contrast throughout the
esophagus and into the stomach. Normal esophageal course and
contour. Normal esophageal mucosal pattern.

A 12.5 mm barium tablet was administered and passed freely to the
stomach without delay.

AP and lateral dedicated imaging of the cervical esophagus is
performed and is normal.

With prone swallows esophageal motility is normal for age.

A small volume of spontaneous gastroesophageal reflux occurred while
the patient was prone and supine (faintly visible on series 15) to
the level of the thoracic inlet. Grossly negative visible stomach
and proximal small bowel; possible small diverticulum of the 2nd
portion of the duodenum (series 19).
IMPRESSION: Positive for a small volume of spontaneous gastroesophageal reflux.
Otherwise negative esophagram.

## 2019-06-09 ENCOUNTER — Encounter: Payer: Self-pay | Admitting: Internal Medicine

## 2019-06-09 ENCOUNTER — Other Ambulatory Visit: Payer: Self-pay

## 2019-06-09 ENCOUNTER — Ambulatory Visit: Payer: 59 | Admitting: Internal Medicine

## 2019-06-09 ENCOUNTER — Telehealth: Payer: Self-pay

## 2019-06-09 VITALS — BP 110/80 | HR 64 | Temp 98.1°F | Ht 65.0 in | Wt 122.0 lb

## 2019-06-09 DIAGNOSIS — J029 Acute pharyngitis, unspecified: Secondary | ICD-10-CM | POA: Diagnosis not present

## 2019-06-09 DIAGNOSIS — H6503 Acute serous otitis media, bilateral: Secondary | ICD-10-CM | POA: Diagnosis not present

## 2019-06-09 MED ORDER — AZITHROMYCIN 250 MG PO TABS: ORAL_TABLET | ORAL | 0 refills | Status: DC

## 2019-06-09 MED FILL — AZITHROMYCIN 250 MG TABLET: 250 | 5 days supply | Qty: 6 | Fill #0
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
## 2019-06-09 NOTE — Telephone Encounter (Signed)
Come at 12:30 or 4:30. Call us upon arrival as door is locked.

## 2019-06-09 NOTE — Telephone Encounter (Signed)
Scheduled at 12:30pm. 

## 2019-06-09 NOTE — Telephone Encounter (Signed)
Patient called would like to be seen, has had a sore throat it is getting worse and now her eat is hurting. She has not been running a fever.  Call back number [phone]

## 2019-06-16 ENCOUNTER — Ambulatory Visit (INDEPENDENT_AMBULATORY_CARE_PROVIDER_SITE_OTHER): Payer: Self-pay | Admitting: Plastic Surgery

## 2019-06-16 ENCOUNTER — Other Ambulatory Visit: Payer: Self-pay

## 2019-06-16 ENCOUNTER — Encounter: Payer: Self-pay | Admitting: Plastic Surgery

## 2019-06-16 VITALS — BP 133/88 | HR 58 | Temp 97.8°F

## 2019-06-16 DIAGNOSIS — Z719 Counseling, unspecified: Secondary | ICD-10-CM

## 2019-06-16 NOTE — Progress Notes (Signed)
Botulinum Toxin Procedure Note  Procedure: Cosmetic botulinum toxin  Pre-operative Diagnosis: Dynamic rhytide  Post-operative Diagnosis: Same  Complications:  None  Brief history: The patient desires botulinum toxin injection of her forehead. I discussed with the patient this proposed procedure of botulinum toxin injections, which is customized depending on the particular needs of the patient. It is performed on facial rhytids as a temporary correction. The alternatives were discussed with the patient. The risks were addressed including bleeding, scarring, infection, damage to deeper structures, asymmetry, and chronic pain, which may occur infrequently after a procedure. The individual's choice to undergo a surgical procedure is based on the comparison of risks to potential benefits. Other risks include unsatisfactory results, brow ptosis, eyelid ptosis, allergic reaction, temporary paralysis, which should go away with time, bruising, blurring disturbances and delayed healing. Botulinum toxin injections do not arrest the aging process or produce permanent tightening of the eyelid.  Operative intervention maybe necessary to maintain the results of a blepharoplasty or botulinum toxin. The patient understands and wishes to proceed.  Procedure: The area was prepped with alcohol and dried with a clean gauze. Using a clean technique, the botulinum toxin was diluted with 1.25 cc of preservative-free normal saline which was slowly injected with an 18 gauge needle in a tuberculin syringes.  A 32 gauge needles were then used to inject the botulinum toxin. This mixture allow for an aliquot of 5 units per 0.1 cc in each injection site.    Subsequently the mixture was injected in the glabellar and forehead area with preservation of the temporal branch to the lateral eyebrow as well as into each lateral canthal area beginning from the lateral orbital rim medial to the zygomaticus major in 3 separate areas. A total  of 35 Units of botulinum toxin was used. The forehead and glabellar area was injected with care to inject intramuscular only while holding pressure on the supratrochlear vessels in each area during each injection on either side of the medial corrugators. The injection proceeded vertically superiorly to the medial 2/3 of the frontalis muscle and superior 2/3 of the lateral frontalis, again with preservation of the frontal branch.   No complications were noted. Light pressure was held for 5 minutes. She was instructed explicitly in post-operative care.  Botox LOT:  [id number] C4 EXP:  8/23

## 2019-06-18 NOTE — Patient Instructions (Signed)
It was a pleasure to see you today.  Please take Zithromax Z-PAK 2 p.o. day 1 followed by 1 p.o. days 2 through 5.  Rest and drink plenty of fluids and call if symptoms do not improve.

## 2019-07-07 ENCOUNTER — Other Ambulatory Visit: Payer: Self-pay

## 2019-07-07 ENCOUNTER — Ambulatory Visit: Payer: 59 | Admitting: Internal Medicine

## 2019-07-07 ENCOUNTER — Encounter: Payer: Self-pay | Admitting: Internal Medicine

## 2019-07-07 VITALS — BP 98/70 | HR 60 | Temp 97.7°F | Ht 65.0 in | Wt 121.0 lb

## 2019-07-07 DIAGNOSIS — M542 Cervicalgia: Secondary | ICD-10-CM | POA: Diagnosis not present

## 2019-07-07 DIAGNOSIS — K219 Gastro-esophageal reflux disease without esophagitis: Secondary | ICD-10-CM

## 2019-07-07 DIAGNOSIS — R197 Diarrhea, unspecified: Secondary | ICD-10-CM | POA: Diagnosis not present

## 2019-07-07 MED ORDER — PANTOPRAZOLE SODIUM 40 MG PO TBEC: 40.0000 mg | DELAYED_RELEASE_TABLET | Freq: Every day | ORAL | 1 refills | Status: DC

## 2019-07-07 MED FILL — PANTOPRAZOLE SOD DR 40 MG T: 40 | 90 days supply | Qty: 90 | Fill #0
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
## 2019-07-10 LAB — CBC WITH DIFFERENTIAL/PLATELET
Absolute Monocytes: 466 cells/uL (ref 200–950)
Basophils Absolute: 42 cells/uL (ref 0–200)
Basophils Relative: 0.8 %
Eosinophils Absolute: 260 cells/uL (ref 15–500)
Eosinophils Relative: 4.9 %
HCT: 43.7 % (ref 35.0–45.0)
Hemoglobin: 14.4 g/dL (ref 11.7–15.5)
Lymphs Abs: 1405 cells/uL (ref 850–3900)
MCH: 28.6 pg (ref 27.0–33.0)
MCHC: 33 g/dL (ref 32.0–36.0)
MCV: 86.9 fL (ref 80.0–100.0)
MPV: 11.6 fL (ref 7.5–12.5)
Monocytes Relative: 8.8 %
Neutro Abs: 3127 cells/uL (ref 1500–7800)
Neutrophils Relative %: 59 %
Platelets: 183 10*3/uL (ref 140–400)
RBC: 5.03 10*6/uL (ref 3.80–5.10)
RDW: 13.2 % (ref 11.0–15.0)
Total Lymphocyte: 26.5 %
WBC: 5.3 10*3/uL (ref 3.8–10.8)

## 2019-07-10 LAB — TSH: TSH: 1.68 mIU/L (ref 0.40–4.50)

## 2019-07-10 LAB — T4, FREE: Free T4: 1.2 ng/dL (ref 0.8–1.8)

## 2019-07-10 LAB — THYROID PEROXIDASE ANTIBODY: Thyroperoxidase Ab SerPl-aCnc: 3 IU/mL (ref <9)

## 2019-07-17 NOTE — Patient Instructions (Signed)
Labs drawn and pending.  Trial of Protonix 40 mg daily.  May need to see Dr. Leone Payor if GI symptoms do not improve.  Addendum: Labs are all within normal limits.

## 2019-07-24 ENCOUNTER — Other Ambulatory Visit: Payer: Self-pay

## 2019-07-24 ENCOUNTER — Other Ambulatory Visit: Payer: Self-pay | Admitting: Internal Medicine

## 2019-07-24 DIAGNOSIS — R1310 Dysphagia, unspecified: Secondary | ICD-10-CM

## 2019-07-24 DIAGNOSIS — R1013 Epigastric pain: Secondary | ICD-10-CM

## 2019-07-28 ENCOUNTER — Ambulatory Visit
Admission: RE | Admit: 2019-07-28 | Discharge: 2019-07-28 | Disposition: A | Discharge status: Home / Self Care | Payer: 59 | Source: Ambulatory Visit | Attending: Internal Medicine | Admitting: Internal Medicine

## 2019-07-28 DIAGNOSIS — R1013 Epigastric pain: Secondary | ICD-10-CM

## 2019-07-28 DIAGNOSIS — R1310 Dysphagia, unspecified: Secondary | ICD-10-CM

## 2019-07-28 DIAGNOSIS — K30 Functional dyspepsia: Secondary | ICD-10-CM | POA: Diagnosis not present

## 2019-08-03 ENCOUNTER — Other Ambulatory Visit: Payer: Self-pay

## 2019-08-03 ENCOUNTER — Ambulatory Visit
Admission: RE | Admit: 2019-08-03 | Discharge: 2019-08-03 | Disposition: A | Discharge status: Home / Self Care | Payer: 59 | Source: Ambulatory Visit | Attending: Internal Medicine | Admitting: Internal Medicine

## 2019-08-03 DIAGNOSIS — Z1231 Encounter for screening mammogram for malignant neoplasm of breast: Secondary | ICD-10-CM

## 2019-09-05 ENCOUNTER — Other Ambulatory Visit: Payer: Self-pay

## 2019-09-05 ENCOUNTER — Encounter: Payer: Self-pay | Admitting: Plastic Surgery

## 2019-09-05 ENCOUNTER — Ambulatory Visit (INDEPENDENT_AMBULATORY_CARE_PROVIDER_SITE_OTHER): Payer: Self-pay | Admitting: Plastic Surgery

## 2019-09-05 VITALS — BP 117/88 | HR 63 | Temp 97.5°F | Ht 65.0 in | Wt 117.0 lb

## 2019-09-05 DIAGNOSIS — Z719 Counseling, unspecified: Secondary | ICD-10-CM

## 2019-09-05 MED ORDER — ONDANSETRON HCL 4 MG PO TABS: 4.0000 mg | ORAL_TABLET | Freq: Three times a day (TID) | ORAL | 0 refills | Status: AC | PRN

## 2019-09-05 MED ORDER — AMOXICILLIN-POT CLAVULANATE 500-125 MG PO TABS
1.0000 | ORAL_TABLET | Freq: Three times a day (TID) | ORAL | 0 refills | Status: AC
Start: 2019-09-05 — End: 2019-09-08

## 2019-09-05 MED ORDER — HYDROCODONE-ACETAMINOPHEN 5-325 MG PO TABS: 1.0000 | ORAL_TABLET | Freq: Four times a day (QID) | ORAL | 0 refills | Status: AC | PRN

## 2019-09-05 MED FILL — HYDROCODON-APAP 5-325: 5-325 | 5 days supply | Qty: 20 | Fill #0

## 2019-09-05 MED FILL — AMOX-CLAV 500-125 MG TABLET: 500-125 | 3 days supply | Qty: 9 | Fill #0

## 2019-09-05 MED FILL — ONDANSETRON HCL 4 MG TABS: 4 | 5 days supply | Qty: 15 | Fill #0

## 2019-09-05 NOTE — Progress Notes (Signed)
Patient ID: Jacqueline Henry, female    DOB: 05/16/1955, 64 y.o.   MRN: [id number]   Chief Complaint  Patient presents with  . Pre-op Exam    The patient is a 64 year old female here for her history and physical for lateral upper lid blepharoplasty and a forehead lift. She is in very good health. She has not had any recent illnesses or surgeries. She is planning on a colonoscopy but knows to give 24 to 48 hours between that procedure and our procedure for purposes of hydration. She has brow ptosis and dermatochalasis. She has little to no vision in the right eye.   Review of Systems  Constitutional: Negative.   HENT: Negative.   Eyes: Negative.   Respiratory: Negative.   Cardiovascular: Negative.   Gastrointestinal: Negative.   Endocrine: Negative.   Genitourinary: Negative.   Musculoskeletal: Negative.   Hematological: Negative.   Psychiatric/Behavioral: Negative.     Past Medical History:  Diagnosis Date  . Allergy   . Ascending aorta enlargement (HCC)    Mild - 3.8 cm  . Asthma   . GERD (gastroesophageal reflux disease)   . Osteoporosis   . Sjogren's disease (HCC) ?    ?    Past Surgical History:  Procedure Laterality Date  . COLONOSCOPY     Dr Matthias Hughs  . KNEE ARTHROSCOPY Right ~2000  . TONSILLECTOMY        Current Outpatient Medications:  .  albuterol (PROVENTIL HFA;VENTOLIN HFA) 108 (90 Base) MCG/ACT inhaler, Inhale 2 puffs into the lungs every 6 (six) hours as needed for wheezing or shortness of breath., Disp: 1 Inhaler, Rfl: prn .  ezetimibe (ZETIA) 10 MG tablet, TAKE 1 TABLET (10 MG TOTAL) BY MOUTH DAILY. (Patient taking differently: Take 5 mg by mouth daily. ), Disp: 90 tablet, Rfl: 1 .  fish oil-omega-3 fatty acids 1000 MG capsule, Take 2 g by mouth daily., Disp: , Rfl:  .  Multiple Vitamins-Minerals (MULTIVITAMIN WITH MINERALS) tablet, Take 1 tablet by mouth daily., Disp: , Rfl:  .  pantoprazole (PROTONIX) 40 MG tablet, Take 1 tablet (40 mg total) by mouth  daily., Disp: 90 tablet, Rfl: 1 .  ALPRAZolam (XANAX) 0.5 MG tablet, One half to one tab up to 2 times daily as needed for anxiety and or sleep (Patient not taking: Reported on 09/05/2019), Disp: 60 tablet, Rfl: 2 .  amoxicillin-clavulanate (AUGMENTIN) 500-125 MG tablet, Take 1 tablet (500 mg total) by mouth 3 (three) times daily for 3 days., Disp: 9 tablet, Rfl: 0 .  HYDROcodone-acetaminophen (NORCO) 5-325 MG tablet, Take 1 tablet by mouth every 6 (six) hours as needed for up to 5 days., Disp: 20 tablet, Rfl: 0 .  ondansetron (ZOFRAN) 4 MG tablet, Take 1 tablet (4 mg total) by mouth every 8 (eight) hours as needed for up to 5 days for nausea or vomiting., Disp: 15 tablet, Rfl: 0   Objective:   Vitals:   09/05/19 1521  BP: 117/88  Pulse: 63  Temp: (!) 97.5 F (36.4 C)  SpO2: 100%    Physical Exam Vitals and nursing note reviewed.  Constitutional:      Appearance: Normal appearance.  HENT:     Head: Normocephalic and atraumatic.  Pulmonary:     Effort: Pulmonary effort is normal.  Skin:    General: Skin is warm.  Neurological:     General: No focal deficit present.     Mental Status: She is alert and oriented to person, place,  and time.  Psychiatric:        Mood and Affect: Mood normal.        Behavior: Behavior normal.        Thought Content: Thought content normal.     Assessment & Plan:  Encounter for counseling  Plan for blepharoplasty and forehead lift.  The risks that can be encountered with and after a blepharoplasty and forehead lift were discussed and include the following but no limited to these:  Asymmetry, dry eyes, lid lag, sensitivity to sun or bright light, difficulty closing your eyes, outward rolling of the eyelid, change in vision, fluid accumulation, firmness of the area, fat necrosis with death of fat tissue, bleeding, infection, delayed healing, anesthesia risks, skin sensation changes, injury to structures including nerves, blood vessels, and muscles  which may be temporary or permanent, hair loss, allergies to tape, suture materials and glues, blood products, topical preparations or injected agents, skin and contour irregularities, skin discoloration and swelling, deep vein thrombosis, cardiac and pulmonary complications, pain, which may persist, persistent pain, recurrence, poor healing of the incision, possible need for revisional surgery or staged procedures. Thiere can also be persistent swelling, poor wound healing, rippling or loose skin, swelling. Any change in weight fluctuations can alter the outcome. We also discussed the scars and the progression of the scars over time.  The prescriptions were sent to her [facility] pharmacy.    Alena Bills Tyreke Kaeser, DO

## 2019-09-06 DIAGNOSIS — Z800 Family history of malignant neoplasm of digestive organs: Secondary | ICD-10-CM | POA: Diagnosis not present

## 2019-09-06 DIAGNOSIS — R0989 Other specified symptoms and signs involving the circulatory and respiratory systems: Secondary | ICD-10-CM | POA: Diagnosis not present

## 2019-09-08 ENCOUNTER — Encounter: Payer: 59 | Admitting: Plastic Surgery

## 2019-09-11 MED FILL — PANTOPRAZOLE SOD DR 40 MG T: 40 | 90 days supply | Qty: 180 | Fill #0

## 2019-09-28 MED FILL — NULYTELY LEMON-LI SOL: 1 days supply | Qty: 4000 | Fill #0

## 2019-09-29 ENCOUNTER — Encounter: Payer: 59 | Admitting: Plastic Surgery

## 2019-09-29 DIAGNOSIS — Z1159 Encounter for screening for other viral diseases: Secondary | ICD-10-CM | POA: Diagnosis not present

## 2019-10-04 DIAGNOSIS — Z1211 Encounter for screening for malignant neoplasm of colon: Secondary | ICD-10-CM | POA: Diagnosis not present

## 2019-10-04 DIAGNOSIS — Z800 Family history of malignant neoplasm of digestive organs: Secondary | ICD-10-CM | POA: Diagnosis not present

## 2019-10-06 ENCOUNTER — Encounter: Payer: 59 | Admitting: Plastic Surgery

## 2019-10-17 ENCOUNTER — Other Ambulatory Visit: Payer: Self-pay | Admitting: Plastic Surgery

## 2019-10-17 ENCOUNTER — Telehealth: Payer: Self-pay

## 2019-10-17 ENCOUNTER — Encounter: Payer: Self-pay | Admitting: Plastic Surgery

## 2019-10-17 MED ORDER — ERYTHROMYCIN 5 MG/GM OP OINT: 1.0000 "application " | TOPICAL_OINTMENT | Freq: Every day | OPHTHALMIC | 0 refills | Status: DC

## 2019-10-17 MED FILL — ERYTHROMYCIN EYE OINTMENT: 5 | 10 days supply | Qty: 4 | Fill #0

## 2019-10-17 NOTE — Telephone Encounter (Signed)
Received voicemail from patient following up on Mychart message sent this morning regarding right eye issues after surgery on 10/12/2019.   "Jacqueline Henry to Peggye Form, DO     10/17/19 8:27 AM I am still having a bit of scratchy feeling in my right eye. No problem at all with left. Right eye is much improved over immediate post op and still bothersome. I stopped using the ointment after day 2 post op. it tended to make my eye itch and twitch when I used it. Have been using Systane eye lubricant eye drops that my ophthalmologist had recommended for my dry eyes previously. That helps right after I use it but then becomes scratchy again. My vision is not impacted and scratchiness is more annoying than painful. Anything else I should try?  Everything else continues to heal very well. Thanks

## 2019-10-19 NOTE — Telephone Encounter (Signed)
Called and spoke with the patient on (10/17/19), and informed her that I spoke with [facility] and she would like for her to keep using the eye lubricant drops,use the eye pads or towel w/ice to help calm it down, and she will call in Erythromycin ointment (which she can use on both eyes) to [facility].   Patient verbalized understanding and agreed.//AB/CMA

## 2019-10-20 ENCOUNTER — Encounter: Payer: Self-pay | Admitting: Plastic Surgery

## 2019-10-20 ENCOUNTER — Other Ambulatory Visit: Payer: Self-pay

## 2019-10-20 ENCOUNTER — Ambulatory Visit (INDEPENDENT_AMBULATORY_CARE_PROVIDER_SITE_OTHER): Payer: Self-pay | Admitting: Plastic Surgery

## 2019-10-20 VITALS — BP 136/98 | HR 58 | Temp 97.9°F

## 2019-10-20 DIAGNOSIS — H5711 Ocular pain, right eye: Secondary | ICD-10-CM | POA: Diagnosis not present

## 2019-10-20 DIAGNOSIS — Z719 Counseling, unspecified: Secondary | ICD-10-CM

## 2019-10-20 NOTE — Progress Notes (Signed)
The patient is a 64 year old female here for follow-up after undergoing an upper lid blepharoplasty bilaterally and forehead lift.  She is doing extremely well.  She does have bruising and swelling but she states that it is getting better every day.  She was having right eye pain and I had her see the ophthalmologist.  The ophthalmologist says she does not have a corneal abrasion just some swelling from previous eye surgery.  The sutures were removed from the eyes.  Continue with light activity.  Can shower.  I like to see her back in a week.

## 2019-10-26 ENCOUNTER — Other Ambulatory Visit: Payer: Self-pay

## 2019-10-26 ENCOUNTER — Ambulatory Visit (INDEPENDENT_AMBULATORY_CARE_PROVIDER_SITE_OTHER): Payer: 59 | Admitting: Internal Medicine

## 2019-10-26 ENCOUNTER — Encounter: Payer: Self-pay | Admitting: Internal Medicine

## 2019-10-26 VITALS — BP 100/70 | HR 68 | Ht 65.0 in | Wt 117.0 lb

## 2019-10-26 DIAGNOSIS — Z23 Encounter for immunization: Secondary | ICD-10-CM

## 2019-10-26 NOTE — Progress Notes (Signed)
Flu Vaccine given by CMA 

## 2019-10-26 NOTE — Patient Instructions (Signed)
Patient received a flu vaccine IM L deltoid, AV, CMA  

## 2019-10-27 ENCOUNTER — Ambulatory Visit (INDEPENDENT_AMBULATORY_CARE_PROVIDER_SITE_OTHER): Payer: Self-pay | Admitting: Plastic Surgery

## 2019-10-27 ENCOUNTER — Encounter: Payer: Self-pay | Admitting: Plastic Surgery

## 2019-10-27 VITALS — BP 117/78 | HR 67 | Temp 98.2°F

## 2019-10-27 DIAGNOSIS — Z719 Counseling, unspecified: Secondary | ICD-10-CM

## 2019-10-30 ENCOUNTER — Other Ambulatory Visit: Payer: Self-pay

## 2019-10-30 ENCOUNTER — Other Ambulatory Visit: Payer: 59 | Admitting: Internal Medicine

## 2019-10-30 DIAGNOSIS — E7800 Pure hypercholesterolemia, unspecified: Secondary | ICD-10-CM | POA: Diagnosis not present

## 2019-10-30 DIAGNOSIS — M810 Age-related osteoporosis without current pathological fracture: Secondary | ICD-10-CM | POA: Diagnosis not present

## 2019-10-30 DIAGNOSIS — Z0000 Encounter for general adult medical examination without abnormal findings: Secondary | ICD-10-CM

## 2019-10-30 DIAGNOSIS — I77810 Thoracic aortic ectasia: Secondary | ICD-10-CM

## 2019-10-30 DIAGNOSIS — M8580 Other specified disorders of bone density and structure, unspecified site: Secondary | ICD-10-CM | POA: Diagnosis not present

## 2019-10-30 LAB — CBC WITH DIFFERENTIAL/PLATELET
Absolute Monocytes: 337 cells/uL (ref 200–950)
Basophils Absolute: 71 cells/uL (ref 0–200)
Basophils Relative: 1.4 %
Eosinophils Absolute: 270 cells/uL (ref 15–500)
Eosinophils Relative: 5.3 %
HCT: 46.1 % — ABNORMAL HIGH (ref 35.0–45.0)
Hemoglobin: 15 g/dL (ref 11.7–15.5)
Lymphs Abs: 1301 cells/uL (ref 850–3900)
MCH: 28.7 pg (ref 27.0–33.0)
MCHC: 32.5 g/dL (ref 32.0–36.0)
MCV: 88.3 fL (ref 80.0–100.0)
MPV: 11.5 fL (ref 7.5–12.5)
Monocytes Relative: 6.6 %
Neutro Abs: 3121 cells/uL (ref 1500–7800)
Neutrophils Relative %: 61.2 %
Platelets: 208 10*3/uL (ref 140–400)
RBC: 5.22 10*6/uL — ABNORMAL HIGH (ref 3.80–5.10)
RDW: 13 % (ref 11.0–15.0)
Total Lymphocyte: 25.5 %
WBC: 5.1 10*3/uL (ref 3.8–10.8)

## 2019-10-30 LAB — COMPLETE METABOLIC PANEL WITH GFR
AG Ratio: 2 (calc) (ref 1.0–2.5)
ALT: 45 U/L — ABNORMAL HIGH (ref 6–29)
AST: 24 U/L (ref 10–35)
Albumin: 4.5 g/dL (ref 3.6–5.1)
Alkaline phosphatase (APISO): 116 U/L (ref 37–153)
BUN: 20 mg/dL (ref 7–25)
CO2: 27 mmol/L (ref 20–32)
Calcium: 9.7 mg/dL (ref 8.6–10.4)
Chloride: 104 mmol/L (ref 98–110)
Creat: 0.89 mg/dL (ref 0.50–0.99)
GFR, Est African American: 79 mL/min/{1.73_m2} (ref >=60)
GFR, Est Non African American: 68 mL/min/{1.73_m2} (ref >=60)
Globulin: 2.3 g/dL (calc) (ref 1.9–3.7)
Glucose, Bld: 80 mg/dL (ref 65–99)
Potassium: 5 mmol/L (ref 3.5–5.3)
Sodium: 140 mmol/L (ref 135–146)
Total Bilirubin: 0.9 mg/dL (ref 0.2–1.2)
Total Protein: 6.8 g/dL (ref 6.1–8.1)

## 2019-10-30 LAB — TSH: TSH: 2.81 mIU/L (ref 0.40–4.50)

## 2019-10-30 LAB — LIPID PANEL
Cholesterol: 258 mg/dL — ABNORMAL HIGH (ref <200)
HDL: 75 mg/dL (ref >=50)
LDL Cholesterol (Calc): 160 mg/dL (calc) — ABNORMAL HIGH
Non-HDL Cholesterol (Calc): 183 mg/dL (calc) — ABNORMAL HIGH (ref <130)
Total CHOL/HDL Ratio: 3.4 (calc) (ref <5.0)
Triglycerides: 116 mg/dL (ref <150)

## 2019-10-30 LAB — VITAMIN D 25 HYDROXY (VIT D DEFICIENCY, FRACTURES): Vit D, 25-Hydroxy: 39 ng/mL (ref 30–100)

## 2019-10-31 ENCOUNTER — Encounter: Payer: Self-pay | Admitting: Plastic Surgery

## 2019-10-31 NOTE — Progress Notes (Signed)
The patient is a 64 year old female here for follow-up after undergoing an upper lid blepharoplasty and forehead lift.  Overall she is doing extremely well.  She had some irritation to the right eye and saw Dr. Charlotte Sanes.  It has now resolved and the patient states 99% better.  She has a little bit of stitch swelling of the upper lids.  I removed to the forehead sutures today.  She should be really good about sunblock and wearing a hat.  She can start light massage to her upper lids.     Her swelling and bruising is markedly improved. I would like to see her when she comes back to town.  Call with any questions or concerns.

## 2019-11-02 ENCOUNTER — Other Ambulatory Visit: Payer: Self-pay | Admitting: Internal Medicine

## 2019-11-02 MED FILL — EZETIMIBE 10 MG TABS: 10 | 90 days supply | Qty: 90 | Fill #0

## 2019-11-29 ENCOUNTER — Other Ambulatory Visit: Payer: Self-pay | Admitting: Internal Medicine

## 2019-11-29 MED ORDER — ALBUTEROL SULFATE HFA 108 (90 BASE) MCG/ACT IN AERS: 2.0000 | INHALATION_SPRAY | Freq: Four times a day (QID) | RESPIRATORY_TRACT | 99 refills | Status: DC | PRN

## 2019-11-29 MED ORDER — ALPRAZOLAM 0.5 MG PO TABS: ORAL_TABLET | ORAL | 2 refills | Status: DC

## 2019-11-29 MED FILL — ALPRAZolam 0.5 MG TABS: 0.5 | 30 days supply | Qty: 60 | Fill #0

## 2019-11-29 MED FILL — ALBUTEROL SULFATE HFA 108 (: 108 (90 BAS | 75 days supply | Qty: 54 | Fill #0

## 2019-12-01 ENCOUNTER — Encounter: Payer: Self-pay | Admitting: Internal Medicine

## 2019-12-01 ENCOUNTER — Other Ambulatory Visit: Payer: Self-pay

## 2019-12-01 ENCOUNTER — Ambulatory Visit (INDEPENDENT_AMBULATORY_CARE_PROVIDER_SITE_OTHER): Payer: 59 | Admitting: Internal Medicine

## 2019-12-01 VITALS — BP 110/80 | HR 68 | Ht 65.0 in | Wt 121.0 lb

## 2019-12-01 DIAGNOSIS — Z0000 Encounter for general adult medical examination without abnormal findings: Secondary | ICD-10-CM

## 2019-12-01 DIAGNOSIS — R17 Unspecified jaundice: Secondary | ICD-10-CM

## 2019-12-01 DIAGNOSIS — E7800 Pure hypercholesterolemia, unspecified: Secondary | ICD-10-CM

## 2019-12-01 DIAGNOSIS — R002 Palpitations: Secondary | ICD-10-CM | POA: Diagnosis not present

## 2019-12-01 DIAGNOSIS — I77810 Thoracic aortic ectasia: Secondary | ICD-10-CM | POA: Diagnosis not present

## 2019-12-01 DIAGNOSIS — M8580 Other specified disorders of bone density and structure, unspecified site: Secondary | ICD-10-CM | POA: Diagnosis not present

## 2019-12-01 DIAGNOSIS — R7989 Other specified abnormal findings of blood chemistry: Secondary | ICD-10-CM | POA: Diagnosis not present

## 2019-12-01 DIAGNOSIS — K219 Gastro-esophageal reflux disease without esophagitis: Secondary | ICD-10-CM | POA: Diagnosis not present

## 2019-12-01 LAB — POCT URINALYSIS DIPSTICK
Appearance: NEGATIVE
Bilirubin, UA: NEGATIVE
Blood, UA: NEGATIVE
Glucose, UA: NEGATIVE
Ketones, UA: NEGATIVE
Leukocytes, UA: NEGATIVE
Nitrite, UA: NEGATIVE
Odor: NEGATIVE
Protein, UA: NEGATIVE
Spec Grav, UA: 1.01 (ref 1.010–1.025)
Urobilinogen, UA: 0.2 E.U./dL
pH, UA: 6.5 (ref 5.0–8.0)
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
## 2019-12-02 ENCOUNTER — Encounter: Payer: Self-pay | Admitting: Internal Medicine

## 2019-12-02 LAB — HEPATIC FUNCTION PANEL
AG Ratio: 1.9 (calc) (ref 1.0–2.5)
ALT: 13 U/L (ref 6–29)
AST: 19 U/L (ref 10–35)
Albumin: 4.2 g/dL (ref 3.6–5.1)
Alkaline phosphatase (APISO): 71 U/L (ref 37–153)
Bilirubin, Direct: 0.1 mg/dL (ref 0.0–0.2)
Globulin: 2.2 g/dL (calc) (ref 1.9–3.7)
Indirect Bilirubin: 0.5 mg/dL (calc) (ref 0.2–1.2)
Total Bilirubin: 0.6 mg/dL (ref 0.2–1.2)
Total Protein: 6.4 g/dL (ref 6.1–8.1)

## 2019-12-02 NOTE — Patient Instructions (Signed)
It was a pleasure to see you today.  Please have bone density study in the future when you are in this area.  An order has been placed for that at the breast center.  Please call if palpitations persist and we can arrange for a Holter monitor to be placed.  Medications have been refilled as requested.  Immunizations are up-to-date.  Return in 1 year or as needed.  Continue with annual mammography.

## 2019-12-04 ENCOUNTER — Encounter: Payer: Self-pay | Admitting: Internal Medicine

## 2020-02-16 MED FILL — EZETIMIBE 10 MG TABS: 10 | 90 days supply | Qty: 90 | Fill #0

## 2020-02-24 ENCOUNTER — Telehealth: Payer: 59 | Admitting: Physician Assistant

## 2020-02-24 DIAGNOSIS — R5383 Other fatigue: Secondary | ICD-10-CM

## 2020-02-24 DIAGNOSIS — R197 Diarrhea, unspecified: Secondary | ICD-10-CM

## 2020-02-24 MED ORDER — AZITHROMYCIN 250 MG PO TABS
500.0000 mg | ORAL_TABLET | Freq: Every day | ORAL | 0 refills | Status: DC
Start: 2020-02-24 — End: 2020-02-24

## 2020-02-24 MED ORDER — AZITHROMYCIN 250 MG PO TABS
500.0000 mg | ORAL_TABLET | Freq: Every day | ORAL | 0 refills | Status: AC
Start: 2020-02-24 — End: 2020-02-27

## 2020-02-24 NOTE — Progress Notes (Signed)
E-Visit for Corona Virus Screening  Your current symptoms could be consistent with the coronavirus.  Many health care providers can now test patients at their office but not all are.  [facility] has multiple testing sites. For information on our COVID testing locations and hours go to [email or web address]  We are enrolling you in our MyChart Home Monitoring for COVID19 . Daily you will receive a questionnaire within the MyChart website. Our COVID 19 response team will be monitoring your responses daily.  Testing Information: The COVID-19 Community Testing sites are testing BY APPOINTMENT ONLY.  You can schedule online at [email or web address]  If you do not have access to a smart phone or computer you may call [phone] for an appointment.   Additional testing sites in the Community:  . For [facility] Testing sites in [place]  [email or web address]  . For Pop-up testing sites in [place]  [email or web address]  . For [facility] [email or web address]  . For [facility] testing in [place] and [employer] [email or web address]  . For [facility] testing in [facility]   [email or web address]  For  more information about community testing call [phone]   Please quarantine yourself while awaiting your test results. Please stay home for a minimum of 10 days from the first day of illness with improving symptoms and you have had 24 hours of no fever (without the use of Tylenol (Acetaminophen) Motrin (Ibuprofen) or any fever reducing medication).  Also - Do not get tested prior to returning to work because once you have had a positive test the test can stay  positive for more than a month in some cases.   You should wear a mask or cloth face covering over your nose and mouth if you must be around other people or animals, including pets (even at home). Try to stay at least 6 feet away from other people. This will protect the people around you.  Please continue good preventive care measures, including:  frequent hand-washing, avoid touching your face, cover coughs/sneezes, stay out of crowds and keep a 6 foot distance from others.  COVID-19 is a respiratory illness with symptoms that are similar to the flu. Symptoms are typically mild to moderate, but there have been cases of severe illness and death due to the virus.   The following symptoms may appear 2-14 days after exposure: . Fever . Cough . Shortness of breath or difficulty breathing . Chills . Repeated shaking with chills . Muscle pain . Headache . Sore throat . New loss of taste or smell . Fatigue . Congestion or runny nose . Nausea or vomiting . Diarrhea  Go to the nearest hospital ED for assessment if fever/cough/breathlessness are severe or illness seems like a threat to life.  It is vitally important that if you feel that you have an infection such as this virus or any other virus that you stay home and away from places where you may spread it to others.  You should avoid contact with people age 36 and older.     Your symptoms may also be related to Acute Infectious Diarrhea.  Most cases of acute diarrhea are due to infections with virus and bacteria and are self-limited conditions lasting less than 14 days.  For your symptoms you may take Imodium 2 mg tablets that are over the counter at your local pharmacy. Take two tablet now and then one after each loose stool up to 6 a day.  Antibiotics are not needed for most people with diarrhea.  I have prescribed azithromycin 500 mg daily for 3 days due to chronicity of symptoms. If you develop any weakness please have a face to face  evaluation for possible dehydration.    You may also take acetaminophen (Tylenol) as needed for fever.  Reduce your risk of any infection by using the same precautions used for avoiding the common cold or flu:  Marland Kitchen Wash your hands often with soap and warm water for at least 20 seconds.  If soap and water are not readily available, use an alcohol-based hand sanitizer with at least 60% alcohol.  . If coughing or sneezing, cover your mouth and nose by coughing or sneezing into the elbow areas of your shirt or coat, into a tissue or into your sleeve (not your hands). . Avoid shaking hands with others and consider head nods or verbal greetings only. . Avoid touching your eyes, nose, or mouth with unwashed hands.  . Avoid close contact with people who are sick. . Avoid places or events with large numbers of people in one location, like concerts or sporting events. . Carefully consider travel plans you have or are making. . If you are planning any travel outside or inside the [place], visit the CDC's Travelers' Health webpage for the latest health notices. . If you have some symptoms but not all symptoms, continue to monitor at home and seek medical attention if your symptoms worsen. . If you are having a medical emergency, call 911.  HOME CARE . Only take medications as instructed by your medical team. . Drink plenty of fluids and get plenty of rest. . A steam or ultrasonic humidifier can help if you have congestion.   GET HELP RIGHT AWAY IF YOU HAVE EMERGENCY WARNING SIGNS** FOR COVID-19. If you or someone is showing any of these signs seek emergency medical care immediately. Call 911 or proceed to your closest emergency facility if: . You develop worsening high fever. . Trouble breathing . Bluish lips or face . Persistent pain or pressure in the chest . New confusion . Inability to wake or stay awake . You cough up blood. . Your symptoms become more severe  **This list is not all possible  symptoms. Contact your medical provider for any symptoms that are sever or concerning to you.  MAKE SURE YOU   Understand these instructions.  Will watch your condition.  Will get help right away if you are not doing well or get worse.  Your e-visit answers were reviewed by a board certified advanced clinical practitioner to complete your personal care plan.  Depending on the condition, your plan could have included both over the counter or prescription medications.  If there is a problem please reply once you have received a response from your provider.  Your safety is important to [place].  If you have drug allergies check your prescription carefully.    You can use MyChart to ask questions about today's visit, request a non-urgent call back, or ask for a work or school excuse for 24 hours related to this e-Visit. If it has been greater than 24 hours you will need to follow up with your provider, or enter a new e-Visit to address those concerns. You will get an e-mail in the next two days asking about your experience.  I hope that your e-visit has been valuable and will speed your recovery. Thank you for using e-visits.   I spent 5-10 minutes on review and completion of this note- Illa Level [facility]

## 2020-04-01 ENCOUNTER — Encounter: Payer: 59 | Admitting: Thoracic Surgery (Cardiothoracic Vascular Surgery)

## 2020-04-23 ENCOUNTER — Other Ambulatory Visit (HOSPITAL_BASED_OUTPATIENT_CLINIC_OR_DEPARTMENT_OTHER): Payer: Self-pay

## 2020-05-07 ENCOUNTER — Other Ambulatory Visit: Payer: Self-pay | Admitting: Thoracic Surgery (Cardiothoracic Vascular Surgery)

## 2020-05-07 DIAGNOSIS — I7789 Other specified disorders of arteries and arterioles: Secondary | ICD-10-CM

## 2020-05-08 ENCOUNTER — Other Ambulatory Visit: Payer: Self-pay | Admitting: Thoracic Surgery (Cardiothoracic Vascular Surgery)

## 2020-05-08 DIAGNOSIS — I712 Thoracic aortic aneurysm, without rupture, unspecified: Secondary | ICD-10-CM

## 2020-06-04 ENCOUNTER — Encounter: Payer: Self-pay | Admitting: Plastic Surgery

## 2020-06-04 ENCOUNTER — Other Ambulatory Visit: Payer: Self-pay

## 2020-06-04 ENCOUNTER — Ambulatory Visit (INDEPENDENT_AMBULATORY_CARE_PROVIDER_SITE_OTHER): Payer: Self-pay | Admitting: Plastic Surgery

## 2020-06-04 VITALS — BP 143/94 | HR 63

## 2020-06-04 DIAGNOSIS — Z719 Counseling, unspecified: Secondary | ICD-10-CM

## 2020-06-04 NOTE — Progress Notes (Signed)

## 2020-06-14 ENCOUNTER — Other Ambulatory Visit: Payer: 59

## 2020-06-17 ENCOUNTER — Ambulatory Visit: Payer: 59 | Admitting: Thoracic Surgery (Cardiothoracic Vascular Surgery)

## 2020-06-20 ENCOUNTER — Other Ambulatory Visit: Payer: 59

## 2020-07-26 ENCOUNTER — Other Ambulatory Visit: Payer: Self-pay

## 2020-07-26 ENCOUNTER — Ambulatory Visit
Admission: RE | Admit: 2020-07-26 | Discharge: 2020-07-26 | Disposition: A | Discharge status: Home / Self Care | Payer: Medicare Other | Source: Ambulatory Visit | Attending: Thoracic Surgery (Cardiothoracic Vascular Surgery) | Admitting: Thoracic Surgery (Cardiothoracic Vascular Surgery)

## 2020-07-26 DIAGNOSIS — I712 Thoracic aortic aneurysm, without rupture, unspecified: Secondary | ICD-10-CM

## 2020-07-26 MED ORDER — IOPAMIDOL (ISOVUE-370) INJECTION 76%
75.0000 mL | Freq: Once | INTRAVENOUS | Status: AC | PRN
  Administered 2020-07-26: 75 mL via INTRAVENOUS

## 2020-07-29 ENCOUNTER — Other Ambulatory Visit: Payer: Self-pay

## 2020-07-29 ENCOUNTER — Encounter: Payer: Self-pay | Admitting: Thoracic Surgery (Cardiothoracic Vascular Surgery)

## 2020-07-29 ENCOUNTER — Telehealth (INDEPENDENT_AMBULATORY_CARE_PROVIDER_SITE_OTHER): Payer: Medicare Other | Admitting: Thoracic Surgery (Cardiothoracic Vascular Surgery)

## 2020-07-29 DIAGNOSIS — I7789 Other specified disorders of arteries and arterioles: Secondary | ICD-10-CM | POA: Diagnosis not present

## 2020-07-29 NOTE — Progress Notes (Signed)
[address]       [place] [id number]             [phone]     CARDIOTHORACIC SURGERY TELEPHONE VIRTUAL OFFICE NOTE  Referring Provider is Baxley, Jacqueline Cole, MD PCP is Baxley, Jacqueline Cole, MD   HPI:  I spoke with Jacqueline Jacqueline (DOB January 21, 1956 ) via telephone on 07/29/2020 at 2:32 PM and verified that I was speaking with the correct person using more than one form of identification.  We discussed the fact that I was contacting them from my office and they were located at home, as well as the reason(s) for conducting our visit virtually instead of in-person.  The patient expressed understanding the circumstances and agreed to proceed as described.  Patient is a healthy 65 year old female who was found to have mild fusiform aneurysmal enlargement of the ascending aorta on routine imaging several years ago.  I spoke with the patient over the telephone today to see how she is doing and to review the results of her recent CT angiogram.  She has now moved permanently to [place] where she is enjoying retirement.  She remains very active physically and exercises on a regular basis.  She has had absolutely no physical problems whatsoever and she specifically denies any problems with chest pain or back pain.   Current Outpatient Medications  Medication Sig Dispense Refill   albuterol (VENTOLIN HFA) 108 (90 Base) MCG/ACT inhaler INHALE 2 PUFFS INTO THE LUNGS EVERY 6 (SIX) HOURS AS NEEDED FOR WHEEZING OR SHORTNESS OF BREATH. 54 g 99   ezetimibe (ZETIA) 10 MG tablet TAKE 1 TABLET BY MOUTH ONCE A DAY 90 tablet 0   Jacqueline Jacqueline 1000 MG capsule Take 2 g by mouth daily.     Multiple Vitamins-Minerals (MULTIVITAMIN WITH MINERALS) tablet Take 1 tablet by mouth daily.     pantoprazole (PROTONIX) 40 MG tablet Take 1 tablet (40 mg total) by mouth daily. 90 tablet 1   No current facility-administered medications for this visit.     Diagnostic Tests:  CT ANGIOGRAPHY CHEST  WITH CONTRAST   TECHNIQUE: Multidetector CT imaging of the chest was performed using the standard protocol during bolus administration of intravenous contrast. Multiplanar CT image reconstructions and MIPs were obtained to evaluate the vascular anatomy.   CONTRAST:  29mL ISOVUE-370 IOPAMIDOL (ISOVUE-370) INJECTION 76%   COMPARISON:  03/25/2018 chest CT angiogram.   FINDINGS: Cardiovascular: Normal heart size. No significant pericardial effusion/thickening. Aortic root diameter approximately 3.6 cm diameter at the level of the sinuses of Valsalva. Mildly dilated ascending thoracic aorta measuring 3.9 cm in maximum diameter, previously 3.9 cm on 03/25/2018 chest CT using similar measurement technique, stable. Maximum descending thoracic aortic diameter 3.0 cm. Aortic diameter at the diaphragmatic hiatus 2.3 cm. No evidence of aortic dissection, penetrating atherosclerotic ulcer, pseudoaneurysm or intramural hematoma. Patent aortic arch branch vessels. Normal caliber main pulmonary artery. No central pulmonary emboli.   Mediastinum/Nodes: No discrete thyroid nodules. Unremarkable esophagus. No pathologically enlarged axillary, mediastinal or hilar lymph nodes.   Lungs/Pleura: No pneumothorax. No pleural effusion. No acute consolidative airspace disease, lung masses or significant pulmonary nodules.   Upper abdomen: No acute abnormality.   Musculoskeletal: No aggressive appearing focal osseous lesions. Mild thoracic spondylosis.   Review of the MIP images confirms the above findings.   IMPRESSION: 1. Stable mildly dilated 3.9 cm diameter ascending thoracic aorta. Recommend annual imaging followup by CTA or MRA. This recommendation follows  2010 ACCF/AHA/AATS/ACR/ASA/SCA/SCAI/SIR/STS/SVM Guidelines for the Diagnosis and Management of Patients with Thoracic Aortic Disease. Circulation. 2010; 121: M767-M094. Aortic aneurysm NOS (ICD10-I71.9). 2. No acute abnormality.      Electronically Signed   By: Delbert Phenix M.D.   On: 07/26/2020 09:54     Impression:  I have personally reviewed the patient's recent follow-up CT angiogram which demonstrates stable mild fusiform aneurysmal dilatation of the proximal ascending aorta.  The maximum transverse diameter was reported 3.9 cm and appears unchanged from previous imaging studies.  Plan:  I have discussed the nature of the patient's mild enlargement of the aorta at length as well as consensus recommendation for lifelong imaging.  We discussed the possibility of transthoracic echocardiogram versus CT imaging.  We discussed the importance of attention to making sure her blood pressure remains under good control.  The patient will undergo follow-up imaging in 1 year with a local cardiothoracic surgeon in [place].  All of her questions have been answered.    I discussed limitations of evaluation and management via telephone.  The patient was advised to call back for repeat telephone consultation or to seek an in-person evaluation if questions arise or the patient's clinical condition changes in any significant manner.  I spent in excess of 5 minutes of non-face-to-face time during the conduct of this telephone virtual office consultation, including pre-visit review of the patient's records and direct conversation with the patient.      Salvatore Decent. Cornelius Moras, MD 07/29/2020 2:32 PM

## 2020-11-05 ENCOUNTER — Other Ambulatory Visit: Payer: Self-pay | Admitting: Internal Medicine

## 2020-11-05 DIAGNOSIS — Z1231 Encounter for screening mammogram for malignant neoplasm of breast: Secondary | ICD-10-CM

## 2020-12-13 ENCOUNTER — Encounter: Payer: Medicare Other | Admitting: Plastic Surgery

## 2020-12-13 DIAGNOSIS — Z1231 Encounter for screening mammogram for malignant neoplasm of breast: Secondary | ICD-10-CM

## 2021-01-13 ENCOUNTER — Other Ambulatory Visit: Payer: Self-pay | Admitting: Internal Medicine

## 2021-01-13 DIAGNOSIS — Z1231 Encounter for screening mammogram for malignant neoplasm of breast: Secondary | ICD-10-CM

## 2021-01-15 ENCOUNTER — Other Ambulatory Visit: Payer: Self-pay

## 2021-01-15 ENCOUNTER — Ambulatory Visit (INDEPENDENT_AMBULATORY_CARE_PROVIDER_SITE_OTHER): Payer: Medicare Other

## 2021-01-15 DIAGNOSIS — Z1231 Encounter for screening mammogram for malignant neoplasm of breast: Secondary | ICD-10-CM

## 2021-01-16 ENCOUNTER — Encounter: Payer: Medicare Other | Admitting: Plastic Surgery

## 2021-01-16 ENCOUNTER — Ambulatory Visit (INDEPENDENT_AMBULATORY_CARE_PROVIDER_SITE_OTHER): Payer: Self-pay | Admitting: Plastic Surgery

## 2021-01-16 DIAGNOSIS — Z719 Counseling, unspecified: Secondary | ICD-10-CM

## 2021-01-16 NOTE — Progress Notes (Signed)
Patient presents to discuss Botox treatment.  She is bothered by static and dynamic lines in the forehead, glabella and crows feet.  She is status post forehead lift and upper Bleph blepharoplasty by Dr. Ulice Bold.  We reviewed the risks and benefits of Botox treatment and she is interested in moving forward.  32 units of Botox were distributed through the forehead, glabella and crows feet.  This was in his standard injection pattern with 2 injections in the crows feet area on each side and 2 rows of injections for the forehead.  She tolerated this fine.  We will plan to see her again at her next visit.  All of her questions were answered.

## 2021-02-21 ENCOUNTER — Encounter: Payer: Medicare Other | Admitting: Internal Medicine

## 2022-06-22 ENCOUNTER — Other Ambulatory Visit: Payer: Self-pay | Admitting: Internal Medicine

## 2022-06-22 DIAGNOSIS — Z1231 Encounter for screening mammogram for malignant neoplasm of breast: Secondary | ICD-10-CM

## 2022-08-04 ENCOUNTER — Ambulatory Visit
Admission: RE | Admit: 2022-08-04 | Discharge: 2022-08-04 | Disposition: A | Discharge status: Home / Self Care | Payer: Medicare Other | Source: Ambulatory Visit | Attending: Internal Medicine | Admitting: Internal Medicine

## 2022-08-04 DIAGNOSIS — Z1231 Encounter for screening mammogram for malignant neoplasm of breast: Secondary | ICD-10-CM

## 2022-09-30 DIAGNOSIS — Z1231 Encounter for screening mammogram for malignant neoplasm of breast: Secondary | ICD-10-CM

## 2022-10-16 ENCOUNTER — Encounter: Payer: Medicare Other | Admitting: Student

## 2022-10-27 ENCOUNTER — Telehealth: Payer: Self-pay | Admitting: Plastic Surgery

## 2022-10-27 NOTE — Telephone Encounter (Signed)
Ptn complaint forward to ptn experience - got details to P.E.Dr D has already handled

## 2022-11-30 ENCOUNTER — Other Ambulatory Visit: Payer: Medicare Other

## 2022-12-01 ENCOUNTER — Ambulatory Visit: Payer: Medicare Other | Admitting: Internal Medicine

## 2023-06-09 ENCOUNTER — Ambulatory Visit (INDEPENDENT_AMBULATORY_CARE_PROVIDER_SITE_OTHER): Payer: Self-pay | Admitting: Surgical

## 2023-06-09 DIAGNOSIS — Z411 Encounter for cosmetic surgery: Secondary | ICD-10-CM

## 2023-06-09 NOTE — Progress Notes (Signed)
 Botulinum Toxin Procedure Note  Procedure: Cosmetic botulinum toxin  Pre-operative Diagnosis: Dynamic rhytides  Post-operative Diagnosis: Same  Complications:  None  Brief history: The patient desires botulinum toxin injection.  She is aware of the risks including bleeding, damage to deeper structures, asymmetry, brow ptosis, eyelid ptosis, bruising. The patient understands and wishes to proceed.  Procedure: The area was prepped with alcohol and dried with a clean gauze.  Using a clean technique the botulinum toxin was diluted with 2.5 mL of bacteriostatic saline per 100 unit vial which resulted in 4 units per 0.1 mL.  Subsequently the mixture was injected in the glabellar, lateral canthal lines, forehead area with preservation of the temporal branch to the lateral eyebrow. A total of 32 Units of botulinum toxin was used. The forehead and glabellar area was injected with care to inject intramuscular only while holding pressure on the supratrochlear vessels in each area during each injection on either side of the medial corrugators. The injection proceeded vertically superiorly to the medial 2/3 of the frontalis muscle and superior 2/3 of the lateral frontalis, again with preservation of the frontal branch.  No complications were noted. Light pressure was held for 5 minutes. She was instructed explicitly in post-operative care.  Botox LOT:  [id number] EXP:  2027/03

## 2023-09-07 NOTE — Progress Notes (Unsigned)
    Ben Jackson D.[facility] [address] [place] [address] Phone: [phone]   Assessment and Plan:     There are no diagnoses linked to this encounter.  ***   Pertinent previous records reviewed include ***    Follow Up: ***     Subjective:   I, Jacqueline Henry, am serving as a Neurosurgeon for Doctor Morene Mace  Chief Complaint: thigh pain   HPI:   09/08/2023 Patient is a 68 year old female with thigh pain. Patient states   Relevant Historical Information: ***  Additional pertinent review of systems negative.   Current Outpatient Medications:    fish oil-omega-3 fatty acids 1000 MG capsule, Take 2 g by mouth daily., Disp: , Rfl:    Multiple Vitamins-Minerals (MULTIVITAMIN WITH MINERALS) tablet, Take 1 tablet by mouth daily., Disp: , Rfl:    Objective:     There were no vitals filed for this visit.    There is no height or weight on file to calculate BMI.    Physical Exam:    ***   Electronically signed by:  Odis Mace D.[facility] 11:53 AM 09/07/23

## 2023-09-08 ENCOUNTER — Other Ambulatory Visit: Payer: Self-pay

## 2023-09-08 ENCOUNTER — Encounter: Payer: Self-pay | Admitting: Pharmacist

## 2023-09-08 ENCOUNTER — Other Ambulatory Visit (HOSPITAL_COMMUNITY): Payer: Self-pay

## 2023-09-08 ENCOUNTER — Ambulatory Visit (INDEPENDENT_AMBULATORY_CARE_PROVIDER_SITE_OTHER)

## 2023-09-08 ENCOUNTER — Ambulatory Visit: Admitting: Sports Medicine

## 2023-09-08 VITALS — BP 138/80 | HR 78 | Wt 126.0 lb

## 2023-09-08 DIAGNOSIS — M25551 Pain in right hip: Secondary | ICD-10-CM

## 2023-09-08 DIAGNOSIS — M545 Low back pain, unspecified: Secondary | ICD-10-CM

## 2023-09-08 DIAGNOSIS — M79604 Pain in right leg: Secondary | ICD-10-CM

## 2023-09-08 DIAGNOSIS — M79651 Pain in right thigh: Secondary | ICD-10-CM

## 2023-09-08 MED ORDER — CELECOXIB 200 MG PO CAPS
200.0000 mg | ORAL_CAPSULE | Freq: Two times a day (BID) | ORAL | 0 refills | Status: DC
  Filled 2023-09-08 – 2023-09-11 (×2): qty 60, 30d supply, fill #0

## 2023-09-08 NOTE — Patient Instructions (Addendum)
 Knee HEP. - Start celebrex  twice daily x2 weeks.  If still having pain after 2 weeks, complete 3rd-week of NSAID. May use remaining NSAID as needed once daily for pain control.  Do not to use additional over-the-counter NSAIDs (ibuprofen, naproxen, Advil, Aleve, etc.) while taking prescription NSAIDs.  May use Tylenol  [phone] mg 2 to 3 times a day for breakthrough pain.  Activity as tolerated. Follow up in 4 weeks.

## 2023-09-09 ENCOUNTER — Other Ambulatory Visit (HOSPITAL_COMMUNITY): Payer: Self-pay

## 2023-09-11 ENCOUNTER — Other Ambulatory Visit (HOSPITAL_COMMUNITY): Payer: Self-pay

## 2023-09-13 ENCOUNTER — Other Ambulatory Visit: Payer: Self-pay

## 2023-09-22 ENCOUNTER — Ambulatory Visit (INDEPENDENT_AMBULATORY_CARE_PROVIDER_SITE_OTHER): Payer: Self-pay | Admitting: Surgical

## 2023-09-22 ENCOUNTER — Encounter: Admitting: Plastic Surgery

## 2023-09-22 ENCOUNTER — Ambulatory Visit: Payer: Self-pay | Admitting: Sports Medicine

## 2023-09-22 DIAGNOSIS — Z411 Encounter for cosmetic surgery: Secondary | ICD-10-CM

## 2023-09-22 NOTE — Progress Notes (Signed)
 Botulinum Toxin Procedure Note  Procedure: Cosmetic botulinum toxin  Pre-operative Diagnosis: Dynamic rhytides  Post-operative Diagnosis: Same  Complications:  None  Brief history: The patient desires botulinum toxin injection.  She is aware of the risks including bleeding, damage to deeper structures, asymmetry, brow ptosis, eyelid ptosis, bruising. The patient understands and wishes to proceed.  Procedure: The area was prepped with alcohol and dried with a clean gauze.  Using a clean technique the botulinum toxin was diluted with 2.5 mL of bacteriostatic saline per 100 unit vial which resulted in 4 units per 0.1 mL.  Subsequently the mixture was injected in the glabellar, lateral canthal lines. A total of 24 Units of botulinum toxin was used. The glabellar area was injected with care to inject intramuscular only while holding pressure on the supratrochlear vessels in each area during each injection on either side of the medial corrugators. The injection proceeded vertically superiorly to the medial 2/3 of the frontalis muscle and superior 2/3 of the lateral frontalis, again with preservation of the frontal branch.  No complications were noted. Light pressure was held. She was instructed explicitly in post-operative care.  Botox LOT:  [id number] EXP:  06/2025

## 2023-10-05 ENCOUNTER — Ambulatory Visit (INDEPENDENT_AMBULATORY_CARE_PROVIDER_SITE_OTHER)

## 2023-10-05 ENCOUNTER — Ambulatory Visit (INDEPENDENT_AMBULATORY_CARE_PROVIDER_SITE_OTHER): Admitting: Sports Medicine

## 2023-10-05 VITALS — BP 132/70 | HR 63 | Ht 65.0 in | Wt 127.0 lb

## 2023-10-05 DIAGNOSIS — M79651 Pain in right thigh: Secondary | ICD-10-CM | POA: Diagnosis not present

## 2023-10-05 DIAGNOSIS — M79641 Pain in right hand: Secondary | ICD-10-CM

## 2023-10-05 DIAGNOSIS — M79604 Pain in right leg: Secondary | ICD-10-CM | POA: Diagnosis not present

## 2023-10-05 NOTE — Patient Instructions (Signed)
 Hand HEP   - Use Celebrex  200 mg 2x daily as needed for pain.  Recommend limiting chronic NSAIDs to 1-2 doses per week to prevent long-term side effects.  As needed follow up

## 2023-10-05 NOTE — Progress Notes (Signed)
 [facility] [address] [place] [address] Phone: [phone]   Assessment and Plan:     1. Right hand pain (Primary) -Chronic with exacerbation, initial visit - Increased hypertonicity of right third digit flexor and extensor tendons present for 9+ months - Mild improvement with Celebrex  course that was used for quadriceps tendinitis, however contracture and discomfort returned after completing Celebrex  course - X-ray obtained in clinic.  My interpretation: No acute fracture or dislocation.  - Patient cannot use topical Voltaren gel because the gel can be toxic to dogs -Start HEP for hands - Use Celebrex  200 mg daily as needed for pain.  Recommend limiting chronic NSAIDs to 1-2 doses per week to prevent long-term side effects.   15 additional minutes spent for educating Therapeutic Home Exercise Program.  This included exercises focusing on stretching, strengthening, with focus on eccentric aspects.   Long term goals include an improvement in range of motion, strength, endurance as well as avoiding reinjury. Patient's frequency would include in 1-2 times a day, 3-5 times a week for a duration of 6-12 weeks. Proper technique shown and discussed handout in great detail with ATC.  All questions were discussed and answered.    2. Right leg pain 3. Right thigh pain -Chronic with exacerbation, subsequent visit - Overall significant improvement in symptoms with resolution of pain after completing Celebrex  course and starting HEP.  Most consistent with resolving medial quadriceps tendinitis - Use Celebrex  200 mg daily as needed for pain.  Recommend limiting chronic NSAIDs to 1-2 doses per week to prevent long-term side effects.  -Continue HEP  Pertinent previous records reviewed include none   Follow Up: As needed if no improvement or worsening of symptoms.  Could consider CSI, likely to flexor tendon at third MCP versus referral to  orthopedic hand surgery   Subjective:   I, Jacqueline Henry am a scribe for Dr. Leonce.   Chief Complaint: Right hand pain  HPI:  09/08/2023 Patient is a 68 year old female with thigh pain. Patient states comes in waves intense, sharp, and quick with no reason. She can be asleep and it wakes her up or she could be walking. Some days it is multiple times a day while others it is once. Walks miles a day lifts weights. Nothing seems to make it worse or better. Nothing she does reproduces the pain. It just happens. Pain is located right above the knee in the thigh area about two inches. Pain level is beyond a 10/10 when it happens.    Duration? May 2025 Did you have an Injury to cause this pain?no Taking Medication for pain? Advil at first Numbness or Tingling?no Does the pain Radiate? no Altered gait or use?no ROM/ impairment of movement?no  10/05/23 Patient states that today is just a follow up visit. Things are going great. The exercises and treatment really helped.   Relevant Historical Information: None pertinent  Additional pertinent review of systems negative.   Current Outpatient Medications:    celecoxib  (CELEBREX ) 200 MG capsule, Take 1 capsule (200 mg total) by mouth 2 (two) times daily., Disp: 60 capsule, Rfl: 0   Multiple Vitamins-Minerals (MULTIVITAMIN WITH MINERALS) tablet, Take 1 tablet by mouth daily., Disp: , Rfl:    Objective:     Vitals:   10/05/23 1036  BP: 132/70  Pulse: 63  SpO2: 95%  Weight: 127 lb (57.6 kg)  Height: 5' 5 (1.651 m)      Body  mass index is 21.13 kg/m.    Physical Exam:    General: Appears well, nad, nontoxic and pleasant Neuro:sensation intact, strength is 5/5 in upper extremities, muscle tone wnl Skin:no susupicious lesions or rashes  Right hand/Wrist:   Hypertonicity of flexor and extensor tendons of third digit.  TTP palmar side of third MCP without palpable nodule No deformity or swelling appreciated. ROM  Ext 90, flexion  70, radial/ulnar deviation 30 nttp over the snuff box, dorsal carpals, volar carpals, radial styloid, ulnar styloid, 1st mcp, tfcc Negative Tinel's, Phalen's, Prayer Tests Negative finklestein Neg tfcc bounce test No pain with resisted ext, flex or deviation    Electronically signed by:  Odis Mace D.[name] 11:05 AM 10/05/23

## 2023-10-06 ENCOUNTER — Ambulatory Visit: Admitting: Sports Medicine

## 2023-10-14 ENCOUNTER — Ambulatory Visit: Payer: Self-pay | Admitting: Sports Medicine

## 2023-10-20 ENCOUNTER — Ambulatory Visit: Admitting: Family Medicine

## 2023-11-02 ENCOUNTER — Ambulatory Visit: Admitting: Family Medicine

## 2023-11-02 ENCOUNTER — Encounter: Payer: Self-pay | Admitting: Family Medicine

## 2023-11-02 VITALS — BP 110/76 | Temp 97.5°F | Ht 64.25 in | Wt 123.4 lb

## 2023-11-02 DIAGNOSIS — J45990 Exercise induced bronchospasm: Secondary | ICD-10-CM | POA: Diagnosis not present

## 2023-11-02 DIAGNOSIS — E7800 Pure hypercholesterolemia, unspecified: Secondary | ICD-10-CM

## 2023-11-02 DIAGNOSIS — M8589 Other specified disorders of bone density and structure, multiple sites: Secondary | ICD-10-CM

## 2023-11-02 DIAGNOSIS — I7789 Other specified disorders of arteries and arterioles: Secondary | ICD-10-CM | POA: Diagnosis not present

## 2023-11-02 DIAGNOSIS — Z23 Encounter for immunization: Secondary | ICD-10-CM | POA: Diagnosis not present

## 2023-11-02 DIAGNOSIS — E559 Vitamin D deficiency, unspecified: Secondary | ICD-10-CM | POA: Diagnosis not present

## 2023-11-02 LAB — CBC WITH DIFFERENTIAL/PLATELET
Basophils Absolute: 0 K/uL (ref 0.0–0.1)
Basophils Relative: 0.7 % (ref 0.0–3.0)
Eosinophils Absolute: 0.3 K/uL (ref 0.0–0.7)
Eosinophils Relative: 4.5 % (ref 0.0–5.0)
HCT: 43.6 % (ref 36.0–46.0)
Hemoglobin: 14.4 g/dL (ref 12.0–15.0)
Lymphocytes Relative: 21.6 % (ref 12.0–46.0)
Lymphs Abs: 1.3 K/uL (ref 0.7–4.0)
MCHC: 33.1 g/dL (ref 30.0–36.0)
MCV: 86.1 fl (ref 78.0–100.0)
Monocytes Absolute: 0.4 K/uL (ref 0.1–1.0)
Monocytes Relative: 7.2 % (ref 3.0–12.0)
Neutro Abs: 3.8 K/uL (ref 1.4–7.7)
Neutrophils Relative %: 66 % (ref 43.0–77.0)
Platelets: 179 K/uL (ref 150.0–400.0)
RBC: 5.06 Mil/uL (ref 3.87–5.11)
RDW: 13.8 % (ref 11.5–15.5)
WBC: 5.8 K/uL (ref 4.0–10.5)

## 2023-11-02 LAB — COMPREHENSIVE METABOLIC PANEL WITH GFR
ALT: 16 U/L (ref 0–35)
AST: 23 U/L (ref 0–37)
Albumin: 4.4 g/dL (ref 3.5–5.2)
Alkaline Phosphatase: 58 U/L (ref 39–117)
BUN: 23 mg/dL (ref 6–23)
CO2: 29 meq/L (ref 19–32)
Calcium: 9.8 mg/dL (ref 8.4–10.5)
Chloride: 105 meq/L (ref 96–112)
Creatinine, Ser: 0.97 mg/dL (ref 0.40–1.20)
GFR: 60.07 mL/min (ref >60.00)
Glucose, Bld: 96 mg/dL (ref 70–99)
Potassium: 4.6 meq/L (ref 3.5–5.1)
Sodium: 140 meq/L (ref 135–145)
Total Bilirubin: 0.8 mg/dL (ref 0.2–1.2)
Total Protein: 7.1 g/dL (ref 6.0–8.3)

## 2023-11-02 LAB — VITAMIN D 25 HYDROXY (VIT D DEFICIENCY, FRACTURES): VITD: 38.2 ng/mL (ref 30.00–100.00)

## 2023-11-02 LAB — LIPID PANEL
Cholesterol: 238 mg/dL — ABNORMAL HIGH (ref 0–200)
HDL: 57.7 mg/dL (ref >39.00)
LDL Cholesterol: 157 mg/dL — ABNORMAL HIGH (ref 0–99)
NonHDL: 180.34
Total CHOL/HDL Ratio: 4
Triglycerides: 116 mg/dL (ref 0.0–149.0)
VLDL: 23.2 mg/dL (ref 0.0–40.0)

## 2023-11-02 NOTE — Patient Instructions (Signed)
 Welcome to [facility]! It was a pleasure meeting you today.  As discussed, Please schedule a 12 month follow up visit today.  Shingles at pharm Get date of Tdap  PLEASE NOTE:  If you had any LAB tests please let us  know if you have not heard back within a few days. You may see your results on MyChart before we have a chance to review them but we will give you a call once they are reviewed by us . If we ordered any REFERRALS today, please let us  know if you have not heard from their office within the next week.  Let us  know through MyChart if you are needing REFILLS, or have your pharmacy send us  the request. You can also use MyChart to communicate with me or any office staff.  Please try these tips to maintain a healthy lifestyle:  Eat most of your calories during the day when you are active. Eliminate processed foods including packaged sweets (pies, cakes, cookies), reduce intake of potatoes, white bread, white pasta, and white rice. Look for whole grain options, oat flour or almond flour.  Each meal should contain half fruits/vegetables, one quarter protein, and one quarter carbs (no bigger than a computer mouse).  Cut down on sweet beverages. This includes juice, soda, and sweet tea. Also watch fruit intake, though this is a healthier sweet option, it still contains natural sugar! Limit to 3 servings daily.  Drink at least 1 glass of water with each meal and aim for at least 8 glasses per day  Exercise at least 150 minutes every week.

## 2023-11-02 NOTE — Progress Notes (Signed)
 New Patient Office Visit  Subjective:  Patient ID: Jacqueline Henry, female    DOB: 10/04/1955  Age: 68 y.o. MRN: [id number]  CC:  Chief Complaint  Patient presents with   New Patient (Initial Visit)    Est care; moved back to town from [place]     HPI Jacqueline Henry presents for new pt [facility]- CT due. Walks, lifting wts.  Discussed the use of AI scribe software for clinical note transcription with the patient, who gave verbal consent to proceed.  History of Present Illness LUEVENIA Henry is a 68 year old female with an ascending aortic aneurysm who presents for a new patient visit and follow-up on her aneurysm.  She has an ascending aortic aneurysm, identified incidentally on a CT scan for another issue. She did not have a CT scan last year and is due for one this year. She has not established care with a new physician after her previous doctor moved to [place] . No symptoms such as dizziness, chest pain, or shortness of breath.  She has a history of exercise-induced asthma but no longer uses an inhaler as she does not experience symptoms. She previously used albuterol  but does not currently have it due to a lack of allergy problems in [place] , though she anticipates needing it again due to allergies in her current location.  She has a history of osteopenia diagnosed years ago. She has not had a DEXA scan in a long time and has experienced adverse reactions to calcium  supplements in the past, leading her to stop taking them.  She has a history of a right knee arthroscopic surgery and multiple eye surgeries as a child for crossed eyes, resulting in significant vision impairment in her right eye. No current issues with blurry or double vision beyond the existing impairment.  She experiences sleep disturbances, falling asleep quickly but waking up around 2-3 AM. She does not feel sleep deprived or negatively impacted by this but finds it annoying.  She is physically active, walking 5-7 miles  a day, lifting weights 3-4 times a week, and cycling. No issues with her physical activities.  She drinks alcohol occasionally, about once a week, and has never smoked. 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  Takes xanax  for flying occ.     Current Outpatient Medications:    ALPRAZolam  (XANAX ) 0.25 MG tablet, Take 0.25 mg by mouth 2 (two) times daily as needed for anxiety (flying)., Disp: , Rfl:    celecoxib  (CELEBREX ) 200 MG capsule, Take 1 capsule (200 mg total) by mouth 2 (two) times daily., Disp: 60 capsule, Rfl: 0   Multiple Vitamins-Minerals (MULTIVITAMIN WITH MINERALS) tablet, Take 1 tablet by mouth daily., Disp: , Rfl:   Past Medical History:  Diagnosis Date   Allergy    Ascending aorta enlargement    Mild - 3.8 cm   Asthma    EIA   Cataract July 2025   left eye   Osteopenia    Sjogren's disease (HCC) ?    ?    Past Surgical History:  Procedure Laterality Date   COLONOSCOPY     Dr Donnald   COSMETIC SURGERY  2021   eyes and forehead   EYE SURGERY Bilateral 1958 - 1960   crossed eyes   KNEE ARTHROSCOPY Right ~2000   TONSILLECTOMY      Family History  Problem Relation Age of Onset  Hypertension Mother    Hyperlipidemia Father    Heart attack Father    Colon cancer Father 24   Cancer Father 4       colon   Breast cancer Sister 38   Cancer Maternal Great-grandmother        breast   Diabetes Neg Hx    Sudden death Neg Hx     Social History   Socioeconomic History   Marital status: Married    Spouse name: Not on file   Number of children: 0   Years of education: Not on file   Highest education level: Master's degree (e.g., MA, MS, MEng, MEd, MSW, MBA)  Occupational History   Occupation: Soil scientist  Tobacco Use   Smoking status: Never   Smokeless tobacco: Never  Vaping Use   Vaping status: Never Used  Substance and Sexual Activity    Alcohol use: Yes    Alcohol/week: 1.0 standard drink of alcohol    Types: 1 Glasses of wine per week    Comment: socially   Drug use: No   Sexual activity: Yes    Birth control/protection: None  Other Topics Concern   Not on file  Social History Narrative   Married, no children   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   1-2 EtOH/week   2-3 caffeine/day   Avid exerciser - long-distance cycling especially   No drug use, no tobacco   Social Drivers of Corporate investment banker Strain: Low Risk  (10/27/2023)   Overall Financial Resource Strain (CARDIA)    Difficulty of Paying Living Expenses: Not hard at all  Food Insecurity: No Food Insecurity (10/27/2023)   Hunger Vital Sign    Worried About Running Out of Food in the Last Year: Never true    Ran Out of Food in the Last Year: Never true  Transportation Needs: No Transportation Needs (10/27/2023)   PRAPARE - Administrator, Civil Service (Medical): No    Lack of Transportation (Non-Medical): No  Physical Activity: Sufficiently Active (10/27/2023)   Exercise Vital Sign    Days of Exercise per Week: 5 days    Minutes of Exercise per Session: 90 min  Stress: No Stress Concern Present (10/27/2023)   [employer] of Occupational Health - Occupational Stress Questionnaire    Feeling of Stress: Only a little  Social Connections: Socially Integrated (10/27/2023)   Social Connection and Isolation Panel    Frequency of Communication with Friends and Family: More than three times a week    Frequency of Social Gatherings with Friends and Family: More than three times a week    Attends Religious Services: 1 to 4 times per year    Active Member of [employer] or Organizations: Yes    Attends Engineer, structural: More than 4 times per year    Marital Status: Married  Catering manager Violence: Not on file    ROS  ROS: Gen: no fever, chills  Skin: no rash, itching ENT: no ear pain, ear drainage,  nasal congestion, rhinorrhea, sinus pressure, sore throat Eyes: no blurry vision, double vision Resp: no cough, wheeze,SOB CV: no CP, palpitations, LE edema,  GI: no heartburn, n/v/d/c, abd pain GU: no dysuria, urgency, frequency, hematuria MSK: no joint pain, myalgias, back pain Neuro: no dizziness, headache, weakness, vertigo Psych: no depression, anxiety, insomnia, SI   Objective:   Today's Vitals: BP 110/76   Temp (!) 97.5 F (36.4 C)   Ht 5' 4.25 (1.632  m)   Wt 123 lb 6.4 oz (56 kg)   BMI 21.02 kg/m   Physical Exam  Gen: WDWN NAD HEENT: NCAT, conjunctiva not injected, sclera nonicteric NECK:  supple, no thyromegaly, no nodes, no carotid bruits CARDIAC: RRR, S1S2+, no murmur. DP 2+B LUNGS: CTAB. No wheezes ABDOMEN:  BS+, soft, NTND, No HSM, no masses EXT:  no edema MSK: no gross abnormalities.  NEURO: A&O x3.  CN II-XII intact.  PSYCH: normal mood. Good eye contact   Ate 3 hrs ago  Assessment & Plan:  Ascending aorta enlargement -     CT ANGIO CHEST AORTA W/CM & OR WO/CM; Future -     CBC with Differential/Platelet -     Comprehensive metabolic panel with GFR -     Lipid panel  Exercise-induced asthma  Avitaminosis D -     VITAMIN D  25 Hydroxy (Vit-D Deficiency, Fractures)  Osteopenia of multiple sites -     VITAMIN D  25 Hydroxy (Vit-D Deficiency, Fractures) -     DG Bone Density; Future  Pure hypercholesterolemia -     Lipid panel  Flu vaccine need -     Flu vaccine HIGH DOSE PF(Fluzone Trivalent)  Assessment and Plan Assessment & Plan Ascending aortic aneurysm   She exhibits no symptoms like dizziness, chest pain, or shortness of breath. A CT scan was not performed last year and is due this year. She has no allergy to contrast dye. Order a CT scan of the chest with contrast at [facility] location. Check kidney function prior to the CT scan due to contrast use.  Osteopenia   She has not had a recent DEXA scan. Previous calcium   supplements caused gastrointestinal upset, and she is not currently taking any. Order a DEXA scan. Encourage dietary calcium  intake with three servings per day.  Asthma, exercise-induced   She has no current symptoms or need for an inhaler. She previously used an albuterol  inhaler but does not currently have one. Allergy symptoms may recur in spring. Consider prescribing an albuterol  inhaler if symptoms recur, especially at altitude.  Hypercholesterolemia   Her cholesterol is borderline elevated. There is a family history of colon cancer and high cholesterol. She is not currently on medication for cholesterol. Order a lipid panel to assess current cholesterol levels.  Dupuytren's contracture, right middle finger (suspected)   There is a suspected Dupuytren's contracture in the right middle finger with decreased range of motion. A previous x-ray showed normal joints. Consider referral to a hand surgeon for evaluation and possible steroid injection if symptoms worsen.  Impaired vision, right eye   She has a history of childhood strabismus and multiple eye surgeries. The right eye has impaired vision, but she can see shades and lights.  Insomnia (sleep maintenance)   She experiences difficulty maintaining sleep, waking up at 2-3 AM. Although she does not feel sleep deprived or negatively impacted, she finds it annoying. Discussed non-pharmacological strategies such as changing the environment and deep breathing.  Low D in past-check as osteopenia as well    Follow-up: Return in about 1 year (around 11/01/2024) for chronic follow-up.   Jenkins CHRISTELLA Carrel, MD

## 2023-11-03 ENCOUNTER — Ambulatory Visit: Payer: Self-pay | Admitting: Family Medicine

## 2023-11-03 NOTE — Progress Notes (Signed)
 Labs great except cholesterol  Your cholesterol levels are elevated.  Work on low cholesterol and lower carbs/sugars diet and  get exercise to try to lower your cholesterol.   Consider meds

## 2023-11-04 ENCOUNTER — Inpatient Hospital Stay: Admission: RE | Admit: 2023-11-04 | Discharge: 2023-11-04 | Attending: Family Medicine | Admitting: Family Medicine

## 2023-11-04 DIAGNOSIS — I7789 Other specified disorders of arteries and arterioles: Secondary | ICD-10-CM

## 2023-11-04 MED ORDER — IOPAMIDOL (ISOVUE-370) INJECTION 76%
75.0000 mL | Freq: Once | INTRAVENOUS | Status: AC | PRN
  Administered 2023-11-04: 75 mL via INTRAVENOUS

## 2023-11-05 NOTE — Progress Notes (Signed)
 Great!

## 2024-01-03 ENCOUNTER — Other Ambulatory Visit: Payer: Self-pay | Admitting: Family Medicine

## 2024-01-03 DIAGNOSIS — Z1231 Encounter for screening mammogram for malignant neoplasm of breast: Secondary | ICD-10-CM

## 2024-01-26 ENCOUNTER — Encounter: Admitting: Physician Assistant

## 2024-01-31 ENCOUNTER — Ambulatory Visit: Admission: RE | Admit: 2024-01-31 | Discharge: 2024-01-31 | Disposition: A | Source: Ambulatory Visit

## 2024-01-31 DIAGNOSIS — Z1231 Encounter for screening mammogram for malignant neoplasm of breast: Secondary | ICD-10-CM

## 2024-02-03 ENCOUNTER — Ambulatory Visit: Payer: Self-pay | Admitting: Family Medicine

## 2024-02-08 NOTE — Progress Notes (Cosign Needed Addendum)
 Botulinum Toxin Procedure Note   Procedure: Cosmetic botulinum toxin   Pre-operative Diagnosis: Dynamic rhytides   Post-operative Diagnosis: Same   Complications:  None   Brief history: The patient desires botulinum toxin injection.  They are aware of the risks including bleeding, damage to deeper structures, asymmetry, brow ptosis, eyelid ptosis, bruising. The patient understands and wishes to proceed.  She was last seen here in clinic for treatment 09/22/2023.  At that time, a total of 24 units was injected into the glabellar region and lateral canthal lines.  She tells me that she was pleased with the outcome of her last injection.  She is more focused on addressing the glabellar/procerus region as well as lateral canthal regions.  Her forehead is without any static rhytids.   Procedure: The area was prepped with alcohol and dried with a clean gauze.  Using a clean technique the botulinum toxin was diluted with 2.5 mL of bacteriostatic saline per 100 unit vial which resulted in 4 units per 0.1 mL.   Subsequently the mixture was injected in the glabellar, forehead area with preservation of the temporal branch to the lateral eyebrow. A total of 24 Units of botulinum toxin was used. The forehead, glabella and crows feet were prepped and injected in a standard injection pattern using 3 injection points for the crows feet, 5-point V-shape glabellar/procerus injection.  They tolerated this fine.      No complications were noted. Light pressure was held for 5 minutes. They were instructed explicitly in post-operative care.   Botox LOT:  [id number] EXP:  2027/12

## 2024-02-09 ENCOUNTER — Ambulatory Visit (INDEPENDENT_AMBULATORY_CARE_PROVIDER_SITE_OTHER): Payer: Self-pay | Admitting: Physician Assistant

## 2024-02-09 DIAGNOSIS — Z411 Encounter for cosmetic surgery: Secondary | ICD-10-CM

## 2024-02-29 ENCOUNTER — Other Ambulatory Visit: Payer: Self-pay | Admitting: Family Medicine

## 2024-02-29 ENCOUNTER — Other Ambulatory Visit (HOSPITAL_COMMUNITY): Payer: Self-pay

## 2024-02-29 ENCOUNTER — Ambulatory Visit: Payer: Self-pay

## 2024-02-29 VITALS — BP 118/76 | HR 62 | Ht 65.0 in | Wt 119.0 lb

## 2024-02-29 DIAGNOSIS — Z0000 Encounter for general adult medical examination without abnormal findings: Secondary | ICD-10-CM | POA: Diagnosis not present

## 2024-02-29 MED ORDER — ALPRAZOLAM 0.25 MG PO TABS
0.2500 mg | ORAL_TABLET | Freq: Two times a day (BID) | ORAL | 1 refills | Status: AC | PRN
  Filled 2024-02-29: qty 15, 8d supply, fill #0

## 2024-02-29 NOTE — Patient Instructions (Signed)
 Ms. Ferraiolo,  Thank you for taking the time for your Medicare Wellness Visit. I appreciate your continued commitment to your health goals. Please review the care plan we discussed, and feel free to reach out if I can assist you further.  Please note that Annual Wellness Visits do not include a physical exam. Some assessments may be limited, especially if the visit was conducted virtually. If needed, we may recommend an in-person follow-up with your provider.  Ongoing Care Seeing your primary care provider every 3 to 6 months helps us  monitor your health and provide consistent, personalized care.   Referrals If a referral was made during today's visit and you haven't received any updates within two weeks, please contact the referred provider directly to check on the status.  Recommended Screenings:  Health Maintenance  Topic Date Due   Hepatitis C Screening  Never done   Pneumococcal Vaccine for age over 26 (1 of 2 - PCV) Never done   Zoster (Shingles) Vaccine (1 of 2) Never done   DTaP/Tdap/Td vaccine (3 - Td or Tdap) 08/05/2022   COVID-19 Vaccine (5 - 2025-26 season) 10/04/2023   Colon Cancer Screening  10/03/2024   Breast Cancer Screening  01/30/2026   Flu Shot  Completed   Osteoporosis screening with Bone Density Scan  Completed   Meningitis B Vaccine  Aged Out       03/11/2017    7:24 AM  Advanced Directives  Does Patient Have a Medical Advance Directive? Yes   Type of Advance Directive Living will;Healthcare Power of Attorney     Data saved with a previous flowsheet row definition    Vision: Annual vision screenings are recommended for early detection of glaucoma, cataracts, and diabetic retinopathy. These exams can also reveal signs of chronic conditions such as diabetes and high blood pressure.  Dental: Annual dental screenings help detect early signs of oral cancer, gum disease, and other conditions linked to overall health, including heart disease and diabetes.  Please  see the attached documents for additional preventive care recommendations.

## 2024-02-29 NOTE — Progress Notes (Addendum)
 "  Chief Complaint  Patient presents with   Medicare Wellness     Subjective:   Jacqueline Henry is a 69 y.o. female who presents for a Medicare Annual Wellness Visit.  Visit info / Clinical Intake: Medicare Wellness Visit Type:: Initial Annual Wellness Visit Persons participating in visit and providing information:: patient Medicare Wellness Visit Mode:: Video Since this visit was completed virtually, some vitals may be partially provided or unavailable. Missing vitals are due to the limitations of the virtual format.: Documented vitals are patient reported If Telephone or Video please confirm:: I connected with patient using audio/video enable telemedicine. I verified patient identity with two identifiers, discussed telehealth limitations, and patient agreed to proceed. Patient Location:: home Provider Location:: home office Interpreter Needed?: No Pre-visit prep was completed: yes AWV questionnaire completed by patient prior to visit?: yes Date:: 02/29/24 Living arrangements:: (Patient-Rptd) lives with spouse/significant other Patient's Overall Health Status Rating: (Patient-Rptd) excellent Typical amount of pain: (Patient-Rptd) some Does pain affect daily life?: (Patient-Rptd) no Are you currently prescribed opioids?: no  Dietary Habits and Nutritional Risks How many meals a day?: (Patient-Rptd) 4 Eats fruit and vegetables daily?: (Patient-Rptd) yes Most meals are obtained by: (Patient-Rptd) preparing own meals In the last 2 weeks, have you had any of the following?: none Diabetic:: no  Functional Status Activities of Daily Living (to include ambulation/medication): (Patient-Rptd) Independent Ambulation: Independent with device- listed below Home Assistive Devices/Equipment: Eyeglasses; Contact lenses Medication Administration: (Patient-Rptd) Independent Home Management (perform basic housework or laundry): (Patient-Rptd) Independent Manage your own finances?: (Patient-Rptd)  yes Primary transportation is: (Patient-Rptd) driving Concerns about vision?: no *vision screening is required for WTM* Concerns about hearing?: no  Fall Screening Falls in the past year?: (Patient-Rptd) 1 Number of falls in past year: (Patient-Rptd) 0 Was there an injury with Fall?: (Patient-Rptd) 0 Fall Risk Category Calculator: (Patient-Rptd) 1 Patient Fall Risk Level: (Patient-Rptd) Low Fall Risk  Fall Risk Patient at Risk for Falls Due to: History of fall(s) Fall risk Follow up: Falls evaluation completed  Home and Transportation Safety: All rugs have non-skid backing?: (Patient-Rptd) yes All stairs or steps have railings?: (Patient-Rptd) yes Grab bars in the bathtub or shower?: (!) (Patient-Rptd) no Have non-skid surface in bathtub or shower?: (Patient-Rptd) yes Good home lighting?: (Patient-Rptd) yes Regular seat belt use?: (Patient-Rptd) yes Hospital stays in the last year:: (Patient-Rptd) no  Cognitive Assessment Difficulty concentrating, remembering, or making decisions? : (Patient-Rptd) no Will 6CIT or Mini Cog be Completed: yes What year is it?: 0 points What month is it?: 0 points Give patient an address phrase to remember (5 components): [address] [place]  About what time is it?: 0 points Count backwards from 20 to 1: 0 points Say the months of the year in reverse: 0 points Repeat the address phrase from earlier: 0 points 6 CIT Score: 0 points  Advance Directives (For Healthcare) Does Patient Have a Medical Advance Directive?: Yes Type of Advance Directive: Healthcare Power of [place]; Living will Copy of Healthcare Power of Attorney in Chart?: No - copy requested Copy of Living Will in Chart?: No - copy requested  Reviewed/Updated  Reviewed/Updated: Reviewed All (Medical, Surgical, Family, Medications, Allergies, Care Teams, Patient Goals)    Allergies (verified) Codeine, Other, and Propoxyphene   Current Medications (verified) Outpatient  Encounter Medications as of 02/29/2024  Medication Sig   ALPRAZolam  (XANAX ) 0.25 MG tablet Take 0.25 mg by mouth 2 (two) times daily as needed for anxiety (flying).   Multiple Vitamins-Minerals (MULTIVITAMIN WITH  MINERALS) tablet Take 1 tablet by mouth daily.   [DISCONTINUED] celecoxib  (CELEBREX ) 200 MG capsule Take 1 capsule (200 mg total) by mouth 2 (two) times daily.   No facility-administered encounter medications on file as of 02/29/2024.    History: Past Medical History:  Diagnosis Date   Allergy 1963   as a child - hay fever   Ascending aorta enlargement    Mild - 3.8 cm   Asthma    EIA   Cataract July 2025   left eye   Osteopenia    osteopenia by Dexa 2010   Sjogren's disease (HCC) ?    ?   Past Surgical History:  Procedure Laterality Date   COLONOSCOPY     Dr Donnald   COSMETIC SURGERY  2021   eyes and forehead   EYE SURGERY Bilateral 1958 - 1960   crossed eyes   KNEE ARTHROSCOPY Right ~2000   TONSILLECTOMY     Family History  Problem Relation Age of Onset   Hypertension Mother    Hyperlipidemia Father    Heart attack Father    Colon cancer Father 41   Cancer Father 62       colon   Breast cancer Sister 46   Cancer Maternal Great-grandmother        breast   Diabetes Neg Hx    Sudden death Neg Hx    Social History   Occupational History   Occupation: Soil Scientist  Tobacco Use   Smoking status: Never   Smokeless tobacco: Never  Vaping Use   Vaping status: Never Used  Substance and Sexual Activity   Alcohol use: Yes    Alcohol/week: 1.0 standard drink of alcohol    Types: 1 Glasses of wine per week    Comment: socially   Drug use: No   Sexual activity: Yes    Birth control/protection: None   Tobacco Counseling Counseling given: Not Answered  SDOH Screenings   Food Insecurity: No Food Insecurity (02/29/2024)  Housing: Low Risk (02/29/2024)  Transportation Needs: No Transportation Needs (02/29/2024)  Utilities: Not At Risk (02/29/2024)   Alcohol Screen: Low Risk (02/29/2024)  Depression (PHQ2-9): Low Risk (02/29/2024)  Financial Resource Strain: Low Risk (02/29/2024)  Physical Activity: Sufficiently Active (02/29/2024)  Social Connections: Moderately Integrated (02/29/2024)  Stress: No Stress Concern Present (02/29/2024)  Tobacco Use: Low Risk (02/29/2024)  Health Literacy: Adequate Health Literacy (02/29/2024)   See flowsheets for full screening details  Depression Screen PHQ 2 & 9 Depression Scale- Over the past 2 weeks, how often have you been bothered by any of the following problems? Little interest or pleasure in doing things: 0 Feeling down, depressed, or hopeless (PHQ Adolescent also includes...irritable): 0 PHQ-2 Total Score: 0 Trouble falling or staying asleep, or sleeping too much: 1 Feeling tired or having little energy: 0 Poor appetite or overeating (PHQ Adolescent also includes...weight loss): 0 Feeling bad about yourself - or that you are a failure or have let yourself or your family down: 0 Trouble concentrating on things, such as reading the newspaper or watching television (PHQ Adolescent also includes...like school work): 0 Moving or speaking so slowly that other people could have noticed. Or the opposite - being so fidgety or restless that you have been moving around a lot more than usual: 0 Thoughts that you would be better off dead, or of hurting yourself in some way: 0 PHQ-9 Total Score: 1 If you checked off any problems, how difficult have these problems made it for you  to do your work, take care of things at home, or get along with other people?: Not difficult at all     Goals Addressed               This Visit's Progress     working on lowering cholesterol (pt-stated)               Objective:    Today's Vitals   02/29/24 1124  BP: 118/76  Pulse: 62  Weight: 119 lb (54 kg)  Height: 5' 5 (1.651 m)   Body mass index is 19.8 kg/m.  Hearing/Vision screen No results  found. Immunizations and Health Maintenance Health Maintenance  Topic Date Due   Hepatitis C Screening  Never done   Pneumococcal Vaccine: 50+ Years (1 of 2 - PCV) Never done   Zoster Vaccines- Shingrix (1 of 2) Never done   DTaP/Tdap/Td (3 - Td or Tdap) 08/05/2022   COVID-19 Vaccine (5 - 2025-26 season) 10/04/2023   Colonoscopy  10/03/2024   Mammogram  01/30/2026   Influenza Vaccine  Completed   Bone Density Scan  Completed   Meningococcal B Vaccine  Aged Out        Assessment/Plan:  This is a routine wellness examination for Jacqueline Henry.  Patient Care Team: Wendolyn Jenkins Jansky, MD as PCP - General (Family Medicine) Leonce Katz, DO as Consulting Physician (Sports Medicine) Dillingham, Estefana RAMAN, DO as Attending Physician (Plastic Surgery) Leslee Reusing, MD as Consulting Physician (Ophthalmology)  I have personally reviewed and noted the following in the patients chart:   Medical and social history Use of alcohol, tobacco or illicit drugs  Current medications and supplements including opioid prescriptions. Functional ability and status Nutritional status Physical activity Advanced directives List of other physicians Hospitalizations, surgeries, and ER visits in previous 12 months Vitals Screenings to include cognitive, depression, and falls Referrals and appointments  No orders of the defined types were placed in this encounter.  In addition, I have reviewed and discussed with patient certain preventive protocols, quality metrics, and best practice recommendations. A written personalized care plan for preventive services as well as general preventive health recommendations were provided to patient.   Ellouise VEAR Haws, LPN   [id number]/72/7973   Return in about 1 year (around 03/05/2025).  After Visit Summary: (MyChart) Due to this being a telephonic visit, the after visit summary with patients personalized plan was offered to patient via MyChart   Nurse Notes: HM Addressed:  Vaccines Due: and discussed  pt is requesting a script for xanax  stating she will need a refill due to future travel  "

## 2024-03-02 ENCOUNTER — Telehealth: Payer: Self-pay | Admitting: Family Medicine

## 2024-03-02 NOTE — Telephone Encounter (Signed)
 LVM to call back with insurance info for 02/29/24 appt. We need member ID & copy of the card

## 2024-11-02 ENCOUNTER — Encounter: Admitting: Family Medicine

## 2025-03-06 ENCOUNTER — Ambulatory Visit: Payer: Self-pay
